# Patient Record
Sex: Female | Born: 1935
Health system: Southern US, Community
[De-identification: ages and names within clinical notes are randomized; demographics above are authoritative.]

## PROBLEM LIST (undated history)

## (undated) DIAGNOSIS — I10 Essential (primary) hypertension: Secondary | ICD-10-CM

## (undated) DIAGNOSIS — G4700 Insomnia, unspecified: Secondary | ICD-10-CM

## (undated) DIAGNOSIS — K56609 Unspecified intestinal obstruction, unspecified as to partial versus complete obstruction: Secondary | ICD-10-CM

## (undated) DIAGNOSIS — K219 Gastro-esophageal reflux disease without esophagitis: Secondary | ICD-10-CM

## (undated) HISTORY — PX: MOUTH SURGERY: SHX715

---

## 2011-02-14 DIAGNOSIS — H903 Sensorineural hearing loss, bilateral: Secondary | ICD-10-CM | POA: Diagnosis not present

## 2011-03-29 DIAGNOSIS — F411 Generalized anxiety disorder: Secondary | ICD-10-CM | POA: Diagnosis not present

## 2011-03-29 DIAGNOSIS — I10 Essential (primary) hypertension: Secondary | ICD-10-CM | POA: Diagnosis not present

## 2011-03-29 DIAGNOSIS — R413 Other amnesia: Secondary | ICD-10-CM | POA: Diagnosis not present

## 2011-04-05 DIAGNOSIS — H43819 Vitreous degeneration, unspecified eye: Secondary | ICD-10-CM | POA: Diagnosis not present

## 2011-04-05 DIAGNOSIS — H35349 Macular cyst, hole, or pseudohole, unspecified eye: Secondary | ICD-10-CM | POA: Diagnosis not present

## 2011-04-05 DIAGNOSIS — H353190 Nonexudative age-related macular degeneration, unspecified eye, stage unspecified: Secondary | ICD-10-CM | POA: Diagnosis not present

## 2011-04-19 DIAGNOSIS — R5381 Other malaise: Secondary | ICD-10-CM | POA: Diagnosis not present

## 2011-04-19 DIAGNOSIS — I10 Essential (primary) hypertension: Secondary | ICD-10-CM | POA: Diagnosis not present

## 2011-04-19 DIAGNOSIS — R413 Other amnesia: Secondary | ICD-10-CM | POA: Diagnosis not present

## 2011-06-07 DIAGNOSIS — E7800 Pure hypercholesterolemia, unspecified: Secondary | ICD-10-CM | POA: Diagnosis not present

## 2011-06-07 DIAGNOSIS — I10 Essential (primary) hypertension: Secondary | ICD-10-CM | POA: Diagnosis not present

## 2011-06-07 DIAGNOSIS — R413 Other amnesia: Secondary | ICD-10-CM | POA: Diagnosis not present

## 2011-08-09 DIAGNOSIS — R413 Other amnesia: Secondary | ICD-10-CM | POA: Diagnosis not present

## 2011-08-09 DIAGNOSIS — J309 Allergic rhinitis, unspecified: Secondary | ICD-10-CM | POA: Diagnosis not present

## 2011-08-09 DIAGNOSIS — I10 Essential (primary) hypertension: Secondary | ICD-10-CM | POA: Diagnosis not present

## 2011-08-09 DIAGNOSIS — R259 Unspecified abnormal involuntary movements: Secondary | ICD-10-CM | POA: Diagnosis not present

## 2011-08-09 DIAGNOSIS — G479 Sleep disorder, unspecified: Secondary | ICD-10-CM | POA: Diagnosis not present

## 2011-08-13 ENCOUNTER — Other Ambulatory Visit: Payer: Self-pay

## 2011-08-13 ENCOUNTER — Encounter (HOSPITAL_COMMUNITY): Payer: Self-pay | Admitting: Emergency Medicine

## 2011-08-13 ENCOUNTER — Emergency Department (HOSPITAL_COMMUNITY)
Admission: EM | Admit: 2011-08-13 | Discharge: 2011-08-13 | Disposition: A | Discharge status: Home / Self Care | Payer: Medicare Other | Attending: Emergency Medicine | Admitting: Emergency Medicine

## 2011-08-13 DIAGNOSIS — H9319 Tinnitus, unspecified ear: Secondary | ICD-10-CM | POA: Insufficient documentation

## 2011-08-13 DIAGNOSIS — G4700 Insomnia, unspecified: Secondary | ICD-10-CM | POA: Insufficient documentation

## 2011-08-13 DIAGNOSIS — R5381 Other malaise: Secondary | ICD-10-CM | POA: Insufficient documentation

## 2011-08-13 DIAGNOSIS — K219 Gastro-esophageal reflux disease without esophagitis: Secondary | ICD-10-CM | POA: Diagnosis not present

## 2011-08-13 DIAGNOSIS — I10 Essential (primary) hypertension: Secondary | ICD-10-CM | POA: Diagnosis not present

## 2011-08-13 DIAGNOSIS — R51 Headache: Secondary | ICD-10-CM | POA: Diagnosis not present

## 2011-08-13 DIAGNOSIS — R6889 Other general symptoms and signs: Secondary | ICD-10-CM | POA: Diagnosis not present

## 2011-08-13 DIAGNOSIS — R509 Fever, unspecified: Secondary | ICD-10-CM | POA: Diagnosis not present

## 2011-08-13 HISTORY — DX: Gastro-esophageal reflux disease without esophagitis: K21.9

## 2011-08-13 HISTORY — DX: Insomnia, unspecified: G47.00

## 2011-08-13 HISTORY — DX: Unspecified intestinal obstruction, unspecified as to partial versus complete obstruction: K56.609

## 2011-08-13 HISTORY — DX: Essential (primary) hypertension: I10

## 2011-08-13 LAB — URINALYSIS, ROUTINE W REFLEX MICROSCOPIC
Bilirubin Urine: NEGATIVE
Nitrite: NEGATIVE
Specific Gravity, Urine: 1.011 (ref 1.005–1.030)
pH: 7.5 (ref 5.0–8.0)

## 2011-08-13 LAB — BASIC METABOLIC PANEL
BUN: 14 mg/dL (ref 6–23)
Chloride: 100 mEq/L (ref 96–112)
Glucose, Bld: 114 mg/dL — ABNORMAL HIGH (ref 70–99)
Potassium: 3.8 mEq/L (ref 3.5–5.1)

## 2011-08-13 LAB — CBC WITH DIFFERENTIAL/PLATELET
Hemoglobin: 13.6 g/dL (ref 12.0–15.0)
Lymphs Abs: 1.7 10*3/uL (ref 0.7–4.0)
MCH: 30.6 pg (ref 26.0–34.0)
Monocytes Relative: 7 % (ref 3–12)
Neutro Abs: 4.9 10*3/uL (ref 1.7–7.7)
Neutrophils Relative %: 68 % (ref 43–77)
RBC: 4.45 MIL/uL (ref 3.87–5.11)

## 2011-08-13 LAB — URINE MICROSCOPIC-ADD ON

## 2011-08-13 LAB — POCT I-STAT TROPONIN I

## 2011-08-13 MED ORDER — MIRTAZAPINE 7.5 MG PO TABS: 7.5000 mg | ORAL_TABLET | Freq: Every day | ORAL | Status: DC

## 2011-08-13 MED ORDER — AMLODIPINE BESYLATE 2.5 MG PO TABS: 2.5000 mg | ORAL_TABLET | Freq: Every day | ORAL | Status: DC

## 2011-08-13 NOTE — ED Notes (Signed)
Assisted pt on bedpan. 

## 2011-08-13 NOTE — ED Provider Notes (Signed)
History     CSN: [id number]  Arrival date & time 08/13/11  1302   First MD Initiated Contact with Patient 08/13/11 1309      Chief Complaint  Patient presents with  . Tremors    (Consider location/radiation/quality/duration/timing/severity/associated sxs/prior treatment) HPI Level 5 caveat due to dementia  Pt brought to the ED via EMS from local assisted living. Pt states she has not been feeling well the last few days, generalized weakness, ringing in her ears, and insomnia. She has history of sleeping problems, previously on Halcyon but has not taken that in a long time. She also has history of essential tremor. She was recently seen at her PCP office and had her metoprolol stopped (presumably due to low HR). She also was started on Ambien 5mg  qhs and donepezil 10mg  qhs. She does not feel that these medications are helping her. She denies taking any extra medications by accident, but son in law at the bedside is concerned about her following her medication regimen accurately.   Past Medical History  Diagnosis Date  . Hypertension   . Gastro - esophageal reflux disease   . Insomnia   . Small bowel obstruction     No past surgical history on file.  No family history on file.  History  Substance Use Topics  . Smoking status: Never Smoker   . Smokeless tobacco: Not on file  . Alcohol Use: No    OB History    Grav Para Term Preterm Abortions TAB SAB Ect Mult Living                  Review of Systems Unable to assess due to mental status.   Allergies  Review of patient's allergies indicates not on file.  Home Medications  No current outpatient prescriptions on file.  BP 167/102  Pulse 64  Temp 98.1 F (36.7 C) (Oral)  Resp 16  Ht 6\' 1"  (1.854 m)  Wt 131 lb (59.421 kg)  BMI 17.28 kg/m2  SpO2 98%  Physical Exam  Nursing note and vitals reviewed. Constitutional: She appears well-developed and well-nourished.  HENT:  Head: Normocephalic and atraumatic.    Eyes: EOM are normal. Pupils are equal, round, and reactive to light.  Neck: Normal range of motion. Neck supple.  Cardiovascular: Normal rate, normal heart sounds and intact distal pulses.   Pulmonary/Chest: Effort normal and breath sounds normal.  Abdominal: Bowel sounds are normal. She exhibits no distension. There is no tenderness.  Musculoskeletal: Normal range of motion. She exhibits no edema and no tenderness.  Neurological: She is alert. She has normal strength. No cranial nerve deficit or sensory deficit. She exhibits normal muscle tone.       Essential tremor of head and to a lesser extent hands  Skin: Skin is warm and dry. No rash noted.  Psychiatric: She has a normal mood and affect.    ED Course  Procedures (including critical care time)   Labs Reviewed  CBC WITH DIFFERENTIAL  BASIC METABOLIC PANEL  URINALYSIS, ROUTINE W REFLEX MICROSCOPIC  SALICYLATE LEVEL   No results found.   No diagnosis found.    MDM   Date: 08/13/2011  Rate: 65  Rhythm: normal sinus rhythm  QRS Axis: normal  Intervals: QT prolonged  ST/T Wave abnormalities: nonspecific ST/T changes  Conduction Disutrbances:none  Narrative Interpretation:   Old EKG Reviewed: none available  Pt is quite hypertensive in the ED. I suspect much of her symptoms are related to medication changes. Will  attempt to contact Dr. Pete Glatter.    3:20 PM Spoke with Dr. Pete Glatter who recommends Remeron 7.5mg  qhs and Norvasc 2.5mg  daily. Family aware of plan. Otherwise labs unremarkable. Will plan to d/c with those medication adjustments.        Charles B. Bernette Mayers, MD 08/13/11 1521

## 2011-08-13 NOTE — ED Notes (Signed)
Bernette Mayers, MD at bedside for evaluation.

## 2011-08-13 NOTE — ED Notes (Signed)
RN/MD notified of blood pressure results.

## 2011-08-13 NOTE — ED Notes (Signed)
Per [facility], patient recently has sleeping medications changed by her PCP.  Patient has not slept in 3 days and now has tremors in jaw and upper body.

## 2011-08-13 NOTE — ED Notes (Signed)
Patients daughter advised that patient took too many of her sleeping pills.  Patient advises that she did also.  Daughter advised that patient does have tremors baseline, but they are grossly exaggerated at this time.  Daughter claims that she also has not slept in 3 days.

## 2011-08-18 ENCOUNTER — Ambulatory Visit (INDEPENDENT_AMBULATORY_CARE_PROVIDER_SITE_OTHER): Payer: Medicare Other | Admitting: Emergency Medicine

## 2011-08-18 VITALS — BP 152/81 | HR 73 | Temp 98.6°F | Resp 17 | Ht 61.0 in | Wt 128.0 lb

## 2011-08-18 DIAGNOSIS — R4182 Altered mental status, unspecified: Secondary | ICD-10-CM | POA: Diagnosis not present

## 2011-08-18 LAB — POCT URINALYSIS DIPSTICK
Nitrite, UA: NEGATIVE
Protein, UA: NEGATIVE
pH, UA: 5.5

## 2011-08-18 LAB — POCT UA - MICROSCOPIC ONLY
Casts, Ur, LPF, POC: NEGATIVE
Yeast, UA: NEGATIVE

## 2011-08-18 NOTE — Progress Notes (Signed)
  Subjective:    Patient ID: Julie Oliver, female    DOB: [birth year], 78 y.o.   MRN: [id number]  Altered Mental Status This is a new problem. The current episode started in the past 7 days. The problem occurs constantly. The problem has been gradually worsening. Pertinent negatives include no abdominal pain, anorexia, arthralgias, change in bowel habit, chest pain, chills, congestion, coughing, diaphoresis, fatigue, fever, headaches, joint swelling, myalgias, nausea, neck pain, numbness, rash, sore throat, swollen glands, urinary symptoms, vertigo, visual change, vomiting or weakness.      Review of Systems  Constitutional: Negative for fever, chills, diaphoresis, activity change, appetite change and fatigue.  HENT: Negative.  Negative for congestion, sore throat and neck pain.   Eyes: Negative.   Respiratory: Negative.  Negative for cough.   Cardiovascular: Negative.  Negative for chest pain.  Gastrointestinal: Negative.  Negative for nausea, vomiting, abdominal pain, anorexia and change in bowel habit.  Genitourinary: Negative.   Musculoskeletal: Negative.  Negative for myalgias, joint swelling and arthralgias.  Skin: Negative for rash.  Neurological: Negative for dizziness, vertigo, tremors, seizures, syncope, facial asymmetry, speech difficulty, weakness, light-headedness, numbness and headaches.  Hematological: Negative.   Psychiatric/Behavioral: Positive for hallucinations, confusion, dysphoric mood, agitation and altered mental status.       Objective:   Physical Exam  Constitutional: She is oriented to person, place, and time. She appears well-developed and well-nourished.  HENT:  Head: Normocephalic and atraumatic.  Eyes: Pupils are equal, round, and reactive to light. No scleral icterus.  Cardiovascular: Normal rate and regular rhythm.   Pulmonary/Chest: Effort normal.  Abdominal: Soft.  Musculoskeletal: Normal range of motion.  Neurological: She is alert and oriented to  person, place, and time. Coordination normal.  Skin: Skin is warm and dry.          Assessment & Plan:   Insomnia after discontinuing sleeping pill.  Having bad dreams.  Thinks she is overmedicated.  meds changed a week ago Friday and then to ER on Tuesday.  Requested halcyon and she was refused.  I suggested that she follow up tomorrow with her doctor that I will not change her meds.  Results for orders placed in visit on 08/18/11  POCT URINALYSIS DIPSTICK      Component Value Range   Color, UA pale yellow     Clarity, UA clear     Glucose, UA neg     Bilirubin, UA neg     Ketones, UA neg     Spec Grav, UA <=1.005     Blood, UA neg     pH, UA 5.5     Protein, UA neg     Urobilinogen, UA 0.2     Nitrite, UA neg     Leukocytes, UA Trace    POCT UA - MICROSCOPIC ONLY      Component Value Range   WBC, Ur, HPF, POC 0-1     RBC, urine, microscopic neg     Bacteria, U Microscopic trace     Mucus, UA neg     Epithelial cells, urine per micros 0-1     Crystals, Ur, HPF, POC neg     Casts, Ur, LPF, POC neg     Yeast, UA neg

## 2011-08-21 ENCOUNTER — Other Ambulatory Visit: Payer: Self-pay | Admitting: *Deleted

## 2011-08-21 DIAGNOSIS — R4182 Altered mental status, unspecified: Secondary | ICD-10-CM

## 2011-08-22 ENCOUNTER — Ambulatory Visit
Admission: RE | Admit: 2011-08-22 | Discharge: 2011-08-22 | Disposition: A | Discharge status: Home / Self Care | Payer: Medicare Other | Source: Ambulatory Visit | Attending: *Deleted | Admitting: *Deleted

## 2011-08-22 DIAGNOSIS — I999 Unspecified disorder of circulatory system: Secondary | ICD-10-CM | POA: Diagnosis not present

## 2011-08-22 DIAGNOSIS — R4182 Altered mental status, unspecified: Secondary | ICD-10-CM | POA: Diagnosis not present

## 2011-08-22 DIAGNOSIS — F29 Unspecified psychosis not due to a substance or known physiological condition: Secondary | ICD-10-CM | POA: Diagnosis not present

## 2011-08-22 MED ORDER — IOHEXOL 300 MG/ML  SOLN
80.0000 mL | Freq: Once | INTRAMUSCULAR | Status: AC | PRN
  Administered 2011-08-22: 80 mL via INTRAVENOUS

## 2011-08-29 DIAGNOSIS — D649 Anemia, unspecified: Secondary | ICD-10-CM | POA: Diagnosis not present

## 2011-09-27 DIAGNOSIS — I10 Essential (primary) hypertension: Secondary | ICD-10-CM | POA: Diagnosis not present

## 2011-09-27 DIAGNOSIS — Z0000 Encounter for general adult medical examination without abnormal findings: Secondary | ICD-10-CM | POA: Diagnosis not present

## 2011-09-27 DIAGNOSIS — E559 Vitamin D deficiency, unspecified: Secondary | ICD-10-CM | POA: Diagnosis not present

## 2011-09-27 DIAGNOSIS — D649 Anemia, unspecified: Secondary | ICD-10-CM | POA: Diagnosis not present

## 2011-11-08 DIAGNOSIS — Z23 Encounter for immunization: Secondary | ICD-10-CM | POA: Diagnosis not present

## 2011-11-08 DIAGNOSIS — I10 Essential (primary) hypertension: Secondary | ICD-10-CM | POA: Diagnosis not present

## 2011-11-25 DIAGNOSIS — H353190 Nonexudative age-related macular degeneration, unspecified eye, stage unspecified: Secondary | ICD-10-CM | POA: Diagnosis not present

## 2011-11-25 DIAGNOSIS — H35349 Macular cyst, hole, or pseudohole, unspecified eye: Secondary | ICD-10-CM | POA: Diagnosis not present

## 2011-11-25 DIAGNOSIS — H43819 Vitreous degeneration, unspecified eye: Secondary | ICD-10-CM | POA: Diagnosis not present

## 2011-12-26 DIAGNOSIS — H353190 Nonexudative age-related macular degeneration, unspecified eye, stage unspecified: Secondary | ICD-10-CM | POA: Diagnosis not present

## 2012-02-25 ENCOUNTER — Other Ambulatory Visit: Payer: Self-pay | Admitting: Neurology

## 2012-02-25 DIAGNOSIS — G500 Trigeminal neuralgia: Secondary | ICD-10-CM

## 2012-02-25 DIAGNOSIS — M792 Neuralgia and neuritis, unspecified: Secondary | ICD-10-CM

## 2012-04-16 DIAGNOSIS — L259 Unspecified contact dermatitis, unspecified cause: Secondary | ICD-10-CM | POA: Diagnosis not present

## 2012-04-16 DIAGNOSIS — M79609 Pain in unspecified limb: Secondary | ICD-10-CM | POA: Diagnosis not present

## 2012-04-16 DIAGNOSIS — B372 Candidiasis of skin and nail: Secondary | ICD-10-CM | POA: Diagnosis not present

## 2012-04-16 DIAGNOSIS — B351 Tinea unguium: Secondary | ICD-10-CM | POA: Diagnosis not present

## 2012-06-12 DIAGNOSIS — Z79899 Other long term (current) drug therapy: Secondary | ICD-10-CM | POA: Diagnosis not present

## 2012-06-12 DIAGNOSIS — H6120 Impacted cerumen, unspecified ear: Secondary | ICD-10-CM | POA: Diagnosis not present

## 2012-06-12 DIAGNOSIS — I129 Hypertensive chronic kidney disease with stage 1 through stage 4 chronic kidney disease, or unspecified chronic kidney disease: Secondary | ICD-10-CM | POA: Diagnosis not present

## 2012-06-19 ENCOUNTER — Ambulatory Visit: Payer: Self-pay | Admitting: Neurology

## 2012-07-17 DIAGNOSIS — I129 Hypertensive chronic kidney disease with stage 1 through stage 4 chronic kidney disease, or unspecified chronic kidney disease: Secondary | ICD-10-CM | POA: Diagnosis not present

## 2012-07-22 DIAGNOSIS — I10 Essential (primary) hypertension: Secondary | ICD-10-CM | POA: Diagnosis not present

## 2012-07-22 DIAGNOSIS — H6120 Impacted cerumen, unspecified ear: Secondary | ICD-10-CM | POA: Diagnosis not present

## 2012-07-22 DIAGNOSIS — H903 Sensorineural hearing loss, bilateral: Secondary | ICD-10-CM | POA: Diagnosis not present

## 2012-11-27 DIAGNOSIS — Z23 Encounter for immunization: Secondary | ICD-10-CM | POA: Diagnosis not present

## 2012-11-27 DIAGNOSIS — K5900 Constipation, unspecified: Secondary | ICD-10-CM | POA: Diagnosis not present

## 2012-11-27 DIAGNOSIS — E559 Vitamin D deficiency, unspecified: Secondary | ICD-10-CM | POA: Diagnosis not present

## 2012-11-27 DIAGNOSIS — Z79899 Other long term (current) drug therapy: Secondary | ICD-10-CM | POA: Diagnosis not present

## 2012-11-27 DIAGNOSIS — I10 Essential (primary) hypertension: Secondary | ICD-10-CM | POA: Diagnosis not present

## 2012-12-04 DIAGNOSIS — Z961 Presence of intraocular lens: Secondary | ICD-10-CM | POA: Diagnosis not present

## 2012-12-04 DIAGNOSIS — H524 Presbyopia: Secondary | ICD-10-CM | POA: Diagnosis not present

## 2012-12-04 DIAGNOSIS — H5210 Myopia, unspecified eye: Secondary | ICD-10-CM | POA: Diagnosis not present

## 2013-01-01 DIAGNOSIS — H353290 Exudative age-related macular degeneration, unspecified eye, stage unspecified: Secondary | ICD-10-CM | POA: Diagnosis not present

## 2013-01-01 DIAGNOSIS — H35349 Macular cyst, hole, or pseudohole, unspecified eye: Secondary | ICD-10-CM | POA: Diagnosis not present

## 2013-01-01 DIAGNOSIS — H43819 Vitreous degeneration, unspecified eye: Secondary | ICD-10-CM | POA: Diagnosis not present

## 2013-01-01 DIAGNOSIS — H353190 Nonexudative age-related macular degeneration, unspecified eye, stage unspecified: Secondary | ICD-10-CM | POA: Diagnosis not present

## 2013-05-28 DIAGNOSIS — I10 Essential (primary) hypertension: Secondary | ICD-10-CM | POA: Diagnosis not present

## 2013-05-28 DIAGNOSIS — M25519 Pain in unspecified shoulder: Secondary | ICD-10-CM | POA: Diagnosis not present

## 2013-05-28 DIAGNOSIS — F329 Major depressive disorder, single episode, unspecified: Secondary | ICD-10-CM | POA: Diagnosis not present

## 2013-05-28 DIAGNOSIS — H6120 Impacted cerumen, unspecified ear: Secondary | ICD-10-CM | POA: Diagnosis not present

## 2013-05-28 DIAGNOSIS — F3289 Other specified depressive episodes: Secondary | ICD-10-CM | POA: Diagnosis not present

## 2013-05-28 DIAGNOSIS — H9319 Tinnitus, unspecified ear: Secondary | ICD-10-CM | POA: Diagnosis not present

## 2013-05-28 DIAGNOSIS — G479 Sleep disorder, unspecified: Secondary | ICD-10-CM | POA: Diagnosis not present

## 2013-05-28 DIAGNOSIS — F411 Generalized anxiety disorder: Secondary | ICD-10-CM | POA: Diagnosis not present

## 2013-06-09 DIAGNOSIS — I10 Essential (primary) hypertension: Secondary | ICD-10-CM | POA: Diagnosis not present

## 2013-06-09 DIAGNOSIS — H903 Sensorineural hearing loss, bilateral: Secondary | ICD-10-CM | POA: Diagnosis not present

## 2013-06-09 DIAGNOSIS — H6120 Impacted cerumen, unspecified ear: Secondary | ICD-10-CM | POA: Diagnosis not present

## 2013-08-18 DIAGNOSIS — G479 Sleep disorder, unspecified: Secondary | ICD-10-CM | POA: Diagnosis not present

## 2013-08-18 DIAGNOSIS — I129 Hypertensive chronic kidney disease with stage 1 through stage 4 chronic kidney disease, or unspecified chronic kidney disease: Secondary | ICD-10-CM | POA: Diagnosis not present

## 2013-08-18 DIAGNOSIS — N183 Chronic kidney disease, stage 3 unspecified: Secondary | ICD-10-CM | POA: Diagnosis not present

## 2013-10-07 DIAGNOSIS — H903 Sensorineural hearing loss, bilateral: Secondary | ICD-10-CM | POA: Diagnosis not present

## 2013-10-07 DIAGNOSIS — I10 Essential (primary) hypertension: Secondary | ICD-10-CM | POA: Diagnosis not present

## 2013-10-07 DIAGNOSIS — H6120 Impacted cerumen, unspecified ear: Secondary | ICD-10-CM | POA: Diagnosis not present

## 2013-10-08 DIAGNOSIS — H353290 Exudative age-related macular degeneration, unspecified eye, stage unspecified: Secondary | ICD-10-CM | POA: Diagnosis not present

## 2013-10-08 DIAGNOSIS — H5210 Myopia, unspecified eye: Secondary | ICD-10-CM | POA: Diagnosis not present

## 2013-10-08 DIAGNOSIS — H524 Presbyopia: Secondary | ICD-10-CM | POA: Diagnosis not present

## 2013-10-22 ENCOUNTER — Encounter: Payer: Self-pay | Admitting: *Deleted

## 2013-12-09 ENCOUNTER — Ambulatory Visit (INDEPENDENT_AMBULATORY_CARE_PROVIDER_SITE_OTHER): Payer: Medicare Other | Admitting: Podiatry

## 2013-12-09 ENCOUNTER — Encounter: Payer: Self-pay | Admitting: Podiatry

## 2013-12-09 VITALS — BP 169/69 | HR 64 | Resp 16

## 2013-12-09 DIAGNOSIS — M2040 Other hammer toe(s) (acquired), unspecified foot: Secondary | ICD-10-CM | POA: Diagnosis not present

## 2013-12-09 DIAGNOSIS — M79673 Pain in unspecified foot: Secondary | ICD-10-CM | POA: Diagnosis not present

## 2013-12-09 DIAGNOSIS — L84 Corns and callosities: Secondary | ICD-10-CM

## 2013-12-09 DIAGNOSIS — B351 Tinea unguium: Secondary | ICD-10-CM

## 2013-12-13 NOTE — Progress Notes (Signed)
Subjective:     Patient ID: Julie Oliver, female   DOB: 03-01-1919, 80 y.o.   MRN: [id number]  HPIpatient presents with caregiver stating that she has painful lesions on the ends of several toes and the toes themselves are sore as they're lifted in the air and they get red and inflamed when she wears shoes   Review of Systems     Objective:   Physical Exam Neurovascular status intact with no significant changes and noted to have nail disease 1-5 both feet that are thick and painful with yellow debris and keratotic lesion distal third left with elevation of the toes of a rigid nature and redness on top of the toes    Assessment:     Chronic mycotic nail infection 1-5 both feet with lesion third left and hammertoe formation    Plan:     H&P performed and debridement of lesions and nail bed 1-5 both feet was accomplished with no iatrogenic bleeding noted

## 2013-12-24 DIAGNOSIS — F325 Major depressive disorder, single episode, in full remission: Secondary | ICD-10-CM | POA: Diagnosis not present

## 2013-12-24 DIAGNOSIS — N183 Chronic kidney disease, stage 3 (moderate): Secondary | ICD-10-CM | POA: Diagnosis not present

## 2013-12-24 DIAGNOSIS — Z23 Encounter for immunization: Secondary | ICD-10-CM | POA: Diagnosis not present

## 2013-12-24 DIAGNOSIS — I129 Hypertensive chronic kidney disease with stage 1 through stage 4 chronic kidney disease, or unspecified chronic kidney disease: Secondary | ICD-10-CM | POA: Diagnosis not present

## 2013-12-24 DIAGNOSIS — Z79899 Other long term (current) drug therapy: Secondary | ICD-10-CM | POA: Diagnosis not present

## 2014-02-12 ENCOUNTER — Encounter (HOSPITAL_COMMUNITY): Payer: Self-pay | Admitting: Emergency Medicine

## 2014-02-12 ENCOUNTER — Emergency Department (HOSPITAL_COMMUNITY)
Admission: EM | Admit: 2014-02-12 | Discharge: 2014-02-12 | Disposition: A | Discharge status: Home / Self Care | Payer: Medicare Other | Attending: Emergency Medicine | Admitting: Emergency Medicine

## 2014-02-12 ENCOUNTER — Emergency Department (HOSPITAL_COMMUNITY): Payer: Medicare Other

## 2014-02-12 DIAGNOSIS — Z79899 Other long term (current) drug therapy: Secondary | ICD-10-CM | POA: Insufficient documentation

## 2014-02-12 DIAGNOSIS — R0789 Other chest pain: Secondary | ICD-10-CM | POA: Insufficient documentation

## 2014-02-12 DIAGNOSIS — K449 Diaphragmatic hernia without obstruction or gangrene: Secondary | ICD-10-CM | POA: Diagnosis not present

## 2014-02-12 DIAGNOSIS — K219 Gastro-esophageal reflux disease without esophagitis: Secondary | ICD-10-CM | POA: Insufficient documentation

## 2014-02-12 DIAGNOSIS — Z7982 Long term (current) use of aspirin: Secondary | ICD-10-CM | POA: Insufficient documentation

## 2014-02-12 DIAGNOSIS — Z8669 Personal history of other diseases of the nervous system and sense organs: Secondary | ICD-10-CM | POA: Diagnosis not present

## 2014-02-12 DIAGNOSIS — I10 Essential (primary) hypertension: Secondary | ICD-10-CM | POA: Diagnosis not present

## 2014-02-12 DIAGNOSIS — R079 Chest pain, unspecified: Secondary | ICD-10-CM | POA: Diagnosis present

## 2014-02-12 LAB — URINALYSIS, ROUTINE W REFLEX MICROSCOPIC
Bilirubin Urine: NEGATIVE
Glucose, UA: NEGATIVE mg/dL
Ketones, ur: NEGATIVE mg/dL
Nitrite: NEGATIVE
PROTEIN: NEGATIVE mg/dL
Specific Gravity, Urine: 1.009 (ref 1.005–1.030)
Urobilinogen, UA: 0.2 mg/dL (ref 0.0–1.0)
pH: 6 (ref 5.0–8.0)

## 2014-02-12 LAB — HEPATIC FUNCTION PANEL
ALK PHOS: 105 U/L (ref 39–117)
ALT: 15 U/L (ref 0–35)
AST: 24 U/L (ref 0–37)
Albumin: 3.9 g/dL (ref 3.5–5.2)
BILIRUBIN INDIRECT: 0.3 mg/dL (ref 0.3–0.9)
BILIRUBIN TOTAL: 0.4 mg/dL (ref 0.3–1.2)
Bilirubin, Direct: 0.1 mg/dL (ref 0.0–0.3)
Total Protein: 7 g/dL (ref 6.0–8.3)

## 2014-02-12 LAB — CBC
HCT: 40.3 % (ref 36.0–46.0)
Hemoglobin: 13.6 g/dL (ref 12.0–15.0)
MCH: 30.4 pg (ref 26.0–34.0)
MCHC: 33.7 g/dL (ref 30.0–36.0)
MCV: 90 fL (ref 78.0–100.0)
PLATELETS: 248 10*3/uL (ref 150–400)
RBC: 4.48 MIL/uL (ref 3.87–5.11)
RDW: 12.8 % (ref 11.5–15.5)
WBC: 8.1 10*3/uL (ref 4.0–10.5)

## 2014-02-12 LAB — BASIC METABOLIC PANEL
ANION GAP: 8 (ref 5–15)
BUN: 25 mg/dL — AB (ref 6–23)
CALCIUM: 9 mg/dL (ref 8.4–10.5)
CO2: 23 mmol/L (ref 19–32)
CREATININE: 1.03 mg/dL (ref 0.50–1.10)
Chloride: 105 mEq/L (ref 96–112)
GFR calc non Af Amer: 46 mL/min — ABNORMAL LOW (ref >90)
GFR, EST AFRICAN AMERICAN: 53 mL/min — AB (ref >90)
Glucose, Bld: 112 mg/dL — ABNORMAL HIGH (ref 70–99)
Potassium: 4.1 mmol/L (ref 3.5–5.1)
Sodium: 136 mmol/L (ref 135–145)

## 2014-02-12 LAB — URINE MICROSCOPIC-ADD ON

## 2014-02-12 LAB — I-STAT TROPONIN, ED: Troponin i, poc: 0 ng/mL (ref 0.00–0.08)

## 2014-02-12 LAB — LIPASE, BLOOD: Lipase: 44 U/L (ref 11–59)

## 2014-02-12 MED ORDER — OMEPRAZOLE 20 MG PO CPDR: 40.0000 mg | DELAYED_RELEASE_CAPSULE | Freq: Every day | ORAL | Status: DC

## 2014-02-12 MED ORDER — DOCUSATE SODIUM 100 MG PO CAPS: 100.0000 mg | ORAL_CAPSULE | Freq: Every day | ORAL | Status: DC

## 2014-02-12 MED ORDER — SUCRALFATE 1 GM/10ML PO SUSP: 1.0000 g | Freq: Three times a day (TID) | ORAL | Status: DC

## 2014-02-12 NOTE — ED Notes (Addendum)
Patient c/o lower chest pain. Hx bowel obstruction with surgical intervention in 1999. Says this pain feels "the exact same." Pain started a week ago but was hoping pain would go away. Describes pain as "sharp" and started between her breasts a week ago. Pain is intermittent for patient. Has consistent constipation problems. Did not lose consciousness this week. Denies SOB. Daughter says she "has noticed her stomach seems more swollen." A&Ox4. Moving all extremities. Neurologically intact.

## 2014-02-12 NOTE — Discharge Instructions (Signed)
Chest Pain (Nonspecific) °It is often hard to give a specific diagnosis for the cause of chest pain. There is always a chance that your pain could be related to something serious, such as a heart attack or a blood clot in the lungs. You need to follow up with your health care provider for further evaluation. °CAUSES  °· Heartburn. °· Pneumonia or bronchitis. °· Anxiety or stress. °· Inflammation around your heart (pericarditis) or lung (pleuritis or pleurisy). °· A blood clot in the lung. °· A collapsed lung (pneumothorax). It can develop suddenly on its own (spontaneous pneumothorax) or from trauma to the chest. °· Shingles infection (herpes zoster virus). °The chest wall is composed of bones, muscles, and cartilage. Any of these can be the source of the pain. °· The bones can be bruised by injury. °· The muscles or cartilage can be strained by coughing or overwork. °· The cartilage can be affected by inflammation and become sore (costochondritis). °DIAGNOSIS  °Lab tests or other studies may be needed to find the cause of your pain. Your health care provider may have you take a test called an ambulatory electrocardiogram (ECG). An ECG records your heartbeat patterns over a 24-hour period. You may also have other tests, such as: °· Transthoracic echocardiogram (TTE). During echocardiography, sound waves are used to evaluate how blood flows through your heart. °· Transesophageal echocardiogram (TEE). °· Cardiac monitoring. This allows your health care provider to monitor your heart rate and rhythm in real time. °· Holter monitor. This is a portable device that records your heartbeat and can help diagnose heart arrhythmias. It allows your health care provider to track your heart activity for several days, if needed. °· Stress tests by exercise or by giving medicine that makes the heart beat faster. °TREATMENT  °· Treatment depends on what may be causing your chest pain. Treatment may include: °¨ Acid blockers for  heartburn. °¨ Anti-inflammatory medicine. °¨ Pain medicine for inflammatory conditions. °¨ Antibiotics if an infection is present. °· You may be advised to change lifestyle habits. This includes stopping smoking and avoiding alcohol, caffeine, and chocolate. °· You may be advised to keep your head raised (elevated) when sleeping. This reduces the chance of acid going backward from your stomach into your esophagus. °Most of the time, nonspecific chest pain will improve within 2-3 days with rest and mild pain medicine.  °HOME CARE INSTRUCTIONS  °· If antibiotics were prescribed, take them as directed. Finish them even if you start to feel better. °· For the next few days, avoid physical activities that bring on chest pain. Continue physical activities as directed. °· Do not use any tobacco products, including cigarettes, chewing tobacco, or electronic cigarettes. °· Avoid drinking alcohol. °· Only take medicine as directed by your health care provider. °· Follow your health care provider's suggestions for further testing if your chest pain does not go away. °· Keep any follow-up appointments you made. If you do not go to an appointment, you could develop lasting (chronic) problems with pain. If there is any problem keeping an appointment, call to reschedule. °SEEK MEDICAL CARE IF:  °· Your chest pain does not go away, even after treatment. °· You have a rash with blisters on your chest. °· You have a fever. °SEEK IMMEDIATE MEDICAL CARE IF:  °· You have increased chest pain or pain that spreads to your arm, neck, jaw, back, or abdomen. °· You have shortness of breath. °· You have an increasing cough, or you cough   up blood.  You have severe back or abdominal pain.  You feel nauseous or vomit.  You have severe weakness.  You faint.  You have chills. This is an emergency. Do not wait to see if the pain will go away. Get medical help at once. Call your local emergency services (911 in [place] Do not drive  yourself to the hospital. MAKE SURE YOU:   Understand these instructions.  Will watch your condition.  Will get help right away if you are not doing well or get worse. Document Released: 11/07/2004 Document Revised: 02/02/2013 Document Reviewed: 09/03/2007 [facility] Patient Information 2015 [place], [place]. This information is not intended to replace advice given to you by your health care provider. Make sure you discuss any questions you have with your health care provider. Hiatal Hernia A hiatal hernia occurs when part of your stomach slides above the muscle that separates your abdomen from your chest (diaphragm). You can be born with a hiatal hernia (congenital), or it may develop over time. In almost all cases of hiatal hernia, only the top part of the stomach pushes through.  Many people have a hiatal hernia with no symptoms. The larger the hernia, the more likely that you will have symptoms. In some cases, a hiatal hernia allows stomach acid to flow back into the tube that carries food from your mouth to your stomach (esophagus). This may cause heartburn symptoms. Severe heartburn symptoms may mean you have developed a condition called gastroesophageal reflux disease (GERD).  CAUSES  Hiatal hernias are caused by a weakness in the opening (hiatus) where your esophagus passes through your diaphragm to attach to the upper part of your stomach. You may be born with a weakness in your hiatus, or a weakness can develop. RISK FACTORS Older age is a major risk factor for a hiatal hernia. Anything that increases pressure on your diaphragm can also increase your risk of a hiatal hernia. This includes:  Pregnancy.  Excess weight.  Frequent constipation. SIGNS AND SYMPTOMS  People with a hiatal hernia often have no symptoms. If symptoms develop, they are almost always caused by GERD. They may include:  Heartburn.  Belching.  Indigestion.  Trouble swallowing.  Coughing or wheezing.  Sore  throat.  Hoarseness.  Chest pain. DIAGNOSIS  A hiatal hernia is sometimes found during an exam for another problem. Your health care provider may suspect a hiatal hernia if you have symptoms of GERD. Tests may be done to diagnose GERD. These may include:  X-rays of your stomach or chest.  An upper gastrointestinal (GI) series. This is an X-ray exam of your GI tract involving the use of a chalky liquid that you swallow. The liquid shows up clearly on the X-ray.  Endoscopy. This is a procedure to look into your stomach using a thin, flexible tube that has a tiny camera and light on the end of it. TREATMENT  If you have no symptoms, you may not need treatment. If you have symptoms, treatment may include:  Dietary and lifestyle changes to help reduce GERD symptoms.  Medicines. These may include:  Over-the-counter antacids.  Medicines that make your stomach empty more quickly.  Medicines that block the production of stomach acid (H2 blockers).  Stronger medicines to reduce stomach acid (proton pump inhibitors).  You may need surgery to repair the hernia if other treatments are not helping. HOME CARE INSTRUCTIONS   Take all medicines as directed by your health care provider.  Quit smoking, if you smoke.  Try to achieve and maintain a healthy body weight.  Eat frequent small meals instead of three large meals a day. This keeps your stomach from getting too full.  Eat slowly.  Do not lie down right after eating.  Do noteat 1-2 hours before bed.   Do not drink beverages with caffeine. These include cola, coffee, cocoa, and tea.  Do not drink alcohol.  Avoid foods that can make symptoms of GERD worse. These may include:  Fatty foods.  Citrus fruits.  Other foods and drinks that contain acid.  Avoid putting pressure on your belly. Anything that puts pressure on your belly increases the amount of acid that may be pushed up into your esophagus.   Avoid bending over,  especially after eating.  Raise the head of your bed by putting blocks under the legs. This keeps your head and esophagus higher than your stomach.  Do not wear tight clothing around your chest or stomach.  Try not to strain when having a bowel movement, when urinating, or when lifting heavy objects. SEEK MEDICAL CARE IF:  Your symptoms are not controlled with medicines or lifestyle changes.  You are having trouble swallowing.  You have coughing or wheezing that will not go away. SEEK IMMEDIATE MEDICAL CARE IF:  Your pain is getting worse.  Your pain spreads to your arms, neck, jaw, teeth, or back.  You have shortness of breath.  You sweat for no reason.  You feel sick to your stomach (nauseous) or vomit.  You vomit blood.  You have bright red blood in your stools.  You have black, tarry stools.  Document Released: 04/20/2003 Document Revised: 06/14/2013 Document Reviewed: 01/15/2013 [facility] Patient Information 2015 [place], [place]. This information is not intended to replace advice given to you by your health care provider. Make sure you discuss any questions you have with your health care provider.

## 2014-02-12 NOTE — ED Provider Notes (Signed)
CSN: [id number]     Arrival date & time 02/12/14  1551 History   First MD Initiated Contact with Patient 02/12/14 1601     Chief Complaint  Patient presents with  . Chest Pain     (Consider location/radiation/quality/duration/timing/severity/associated sxs/prior Treatment) HPI Patient c/o lower chest pain. Hx bowel obstruction with surgical intervention in 1999. Says this pain feels "the exact same." Pain started a week ago but was hoping pain would go away. Describes pain as "sharp" and started between her breasts a week ago. Pain is intermittent for patient. Has consistent constipation problems. Did not lose consciousness this week. Denies SOB. Daughter says she "has noticed her stomach seems more swollen." A&Ox4. Moving all extremities. Neurologically intact. MD agree. Patient does endorse her symptoms have been made worse by trying to eat. She does identify that as waxing and waning. She denies diaphoresis or shortness of breath. No radiation of pain.  The patient's daughter adds that she is chronically hypertensive. She reports is typical for her to have a systolic blood pressure in the 190s. She reports she is compliant with her medications. Past Medical History  Diagnosis Date  . Hypertension   . Gastro - esophageal reflux disease   . Insomnia   . Small bowel obstruction    History reviewed. No pertinent past surgical history. History reviewed. No pertinent family history. History  Substance Use Topics  . Smoking status: Never Smoker   . Smokeless tobacco: Not on file  . Alcohol Use: No   OB History    No data available     Review of Systems 10 Systems reviewed and are negative for acute change except as noted in the HPI.    Allergies  Review of patient's allergies indicates no known allergies.  Home Medications   Prior to Admission medications   Medication Sig Start Date End Date Taking? Authorizing Provider  aspirin 81 MG chewable tablet Chew 81 mg by mouth  daily.   Yes Historical Provider, MD  loratadine (CLARITIN) 10 MG tablet Take 10 mg by mouth daily.   Yes Historical Provider, MD  LORazepam (ATIVAN) 0.5 MG tablet Take 0.5 mg by mouth 2 (two) times daily as needed. For anxiety   Yes Historical Provider, MD  losartan (COZAAR) 50 MG tablet Take 50 mg by mouth 2 (two) times daily.    Yes Historical Provider, MD  omeprazole (PRILOSEC) 20 MG capsule Take 20 mg by mouth daily.   Yes Historical Provider, MD  triazolam (HALCION) 0.25 MG tablet Take 0.25 mg by mouth 2 (two) times daily as needed for sleep.   Yes Historical Provider, MD  verapamil (CALAN-SR) 240 MG CR tablet Take 240 mg by mouth 2 (two) times daily.    Yes Historical Provider, MD  Vitamin D, Ergocalciferol, (DRISDOL) 50000 UNITS CAPS Take 50,000 Units by mouth every 7 (seven) days.   Yes Historical Provider, MD  amLODipine (NORVASC) 2.5 MG tablet Take 1 tablet (2.5 mg total) by mouth daily. 08/13/11 08/12/12  Charles B. Karle Starch, MD  docusate sodium (COLACE) 100 MG capsule Take 1 capsule (100 mg total) by mouth daily. 02/12/14   Charlesetta Shanks, MD  mirtazapine (REMERON) 7.5 MG tablet Take 1 tablet (7.5 mg total) by mouth at bedtime. Patient not taking: Reported on 02/12/2014 08/13/11 09/12/11  Charles B. Karle Starch, MD  omeprazole (PRILOSEC) 20 MG capsule Take 2 capsules (40 mg total) by mouth daily. 02/12/14   Charlesetta Shanks, MD  sucralfate (CARAFATE) 1 GM/10ML suspension Take 10 mLs (1  g total) by mouth 4 (four) times daily -  with meals and at bedtime. 02/12/14   Charlesetta Shanks, MD   BP 220/96 mmHg  Pulse 73  Temp(Src) 98 F (36.7 C) (Oral)  Resp 19  SpO2 97% Physical Exam  Constitutional: She is oriented to person, place, and time. She appears well-developed and well-nourished.  HENT:  Head: Normocephalic and atraumatic.  Eyes: EOM are normal. Pupils are equal, round, and reactive to light.  Neck: Neck supple.  Cardiovascular: Normal rate, regular rhythm, normal heart sounds and intact distal  pulses.   Pulmonary/Chest: Effort normal and breath sounds normal.  Abdominal: Soft. Bowel sounds are normal. She exhibits no distension. There is tenderness (mild epigastric tenderness. Also mild lower abdominal tenderness. There is no guarding or mass in either region.).  Musculoskeletal: Normal range of motion. She exhibits no edema.  Neurological: She is alert and oriented to person, place, and time. She has normal strength. Coordination normal. GCS eye subscore is 4. GCS verbal subscore is 5. GCS motor subscore is 6.  Skin: Skin is warm, dry and intact.  Psychiatric: She has a normal mood and affect.    ED Course  Procedures (including critical care time) Labs Review Labs Reviewed  BASIC METABOLIC PANEL - Abnormal; Notable for the following:    Glucose, Bld 112 (*)    BUN 25 (*)    GFR calc non Af Amer 46 (*)    GFR calc Af Amer 53 (*)    All other components within normal limits  URINALYSIS, ROUTINE W REFLEX MICROSCOPIC - Abnormal; Notable for the following:    APPearance CLOUDY (*)    Hgb urine dipstick TRACE (*)    Leukocytes, UA SMALL (*)    All other components within normal limits  CBC  LIPASE, BLOOD  HEPATIC FUNCTION PANEL  URINE MICROSCOPIC-ADD ON  I-STAT TROPOININ, ED  I-STAT TROPOININ, ED    Imaging Review Dg Abd Acute W/chest  02/12/2014   CLINICAL DATA:  Middle lower chest pain for 1 week.  EXAM: ACUTE ABDOMEN SERIES (ABDOMEN 2 VIEW & CHEST 1 VIEW)  COMPARISON:  None.  FINDINGS: There is no evidence of dilated bowel loops or free intraperitoneal air. Moderate bowel content is identified throughout colon. Prior cholecystectomy clips are identified. Heart size and mediastinal contours are within normal limits. Both lungs are clear.  IMPRESSION: No bowel obstruction or free air. Moderate bowel content identified throughout colon. No acute cardiopulmonary disease.   Electronically Signed   By: Abelardo Diesel M.D.   On: 02/12/2014 17:07     EKG  Interpretation   Date/Time:  Saturday February 12 2014 16:01:46 EST Ventricular Rate:  76 PR Interval:  166 QRS Duration: 100 QT Interval:  380 QTC Calculation: 427 R Axis:   78 Text Interpretation:  Sinus rhythm Atrial premature complex Borderline  repolarization abnormality Baseline wander in lead(s) V6 agree. no  significant change from old. Confirmed by Johnney Killian, MD, Jeannie Done [phone]) on  02/12/2014 4:46:34 PM     19:15 patient is well in appearance sitting up in the chair at the bedside. She is hungry and has no complaints. MDM   Final diagnoses:  Other chest pain  Hiatal hernia  Essential hypertension   At this time history is most suggestive of GI etiology. Patient's concern was for possible obstructive type symptoms. At this time there is no evidence of obstruction. She has not had vomiting. Her pain has been waxing and waning in severity. She reports it is  predominantly precipitated and exacerbated when she eats. The patient has a known history of hiatal hernia. I will increase her omeprazole dose to 40 daily and have her take Carafate for the next week. The patient is to follow-up with her family physician this week for recheck. The patient reports she is chronically hypertensive and poorly controlled with systolic blood pressures routinely in the 190s. At this time there does not appear to be any signs of endorgan damage. The patient and her family members report that her blood pressure is always very high and it won't be coming down.     Charlesetta Shanks, MD 02/12/14 [phone]

## 2014-02-18 DIAGNOSIS — R1013 Epigastric pain: Secondary | ICD-10-CM | POA: Diagnosis not present

## 2014-02-18 DIAGNOSIS — N183 Chronic kidney disease, stage 3 (moderate): Secondary | ICD-10-CM | POA: Diagnosis not present

## 2014-02-18 DIAGNOSIS — I129 Hypertensive chronic kidney disease with stage 1 through stage 4 chronic kidney disease, or unspecified chronic kidney disease: Secondary | ICD-10-CM | POA: Diagnosis not present

## 2014-02-21 ENCOUNTER — Other Ambulatory Visit: Payer: Self-pay | Admitting: Geriatric Medicine

## 2014-02-21 DIAGNOSIS — R109 Unspecified abdominal pain: Secondary | ICD-10-CM

## 2014-02-23 ENCOUNTER — Ambulatory Visit
Admission: RE | Admit: 2014-02-23 | Discharge: 2014-02-23 | Disposition: A | Discharge status: Home / Self Care | Payer: Medicare Other | Source: Ambulatory Visit | Attending: Geriatric Medicine | Admitting: Geriatric Medicine

## 2014-02-23 DIAGNOSIS — N281 Cyst of kidney, acquired: Secondary | ICD-10-CM | POA: Diagnosis not present

## 2014-02-23 DIAGNOSIS — R1010 Upper abdominal pain, unspecified: Secondary | ICD-10-CM | POA: Diagnosis not present

## 2014-02-23 DIAGNOSIS — R109 Unspecified abdominal pain: Secondary | ICD-10-CM

## 2014-02-23 DIAGNOSIS — I700 Atherosclerosis of aorta: Secondary | ICD-10-CM | POA: Diagnosis not present

## 2014-02-23 DIAGNOSIS — K5900 Constipation, unspecified: Secondary | ICD-10-CM | POA: Diagnosis not present

## 2014-02-23 MED ORDER — IOHEXOL 300 MG/ML  SOLN
100.0000 mL | Freq: Once | INTRAMUSCULAR | Status: AC | PRN
  Administered 2014-02-23: 100 mL via INTRAVENOUS

## 2014-04-07 DIAGNOSIS — H35353 Cystoid macular degeneration, bilateral: Secondary | ICD-10-CM | POA: Diagnosis not present

## 2014-04-07 DIAGNOSIS — H5213 Myopia, bilateral: Secondary | ICD-10-CM | POA: Diagnosis not present

## 2014-05-13 DIAGNOSIS — I129 Hypertensive chronic kidney disease with stage 1 through stage 4 chronic kidney disease, or unspecified chronic kidney disease: Secondary | ICD-10-CM | POA: Diagnosis not present

## 2014-05-13 DIAGNOSIS — Z79899 Other long term (current) drug therapy: Secondary | ICD-10-CM | POA: Diagnosis not present

## 2014-05-13 DIAGNOSIS — F329 Major depressive disorder, single episode, unspecified: Secondary | ICD-10-CM | POA: Diagnosis not present

## 2014-05-13 DIAGNOSIS — N183 Chronic kidney disease, stage 3 (moderate): Secondary | ICD-10-CM | POA: Diagnosis not present

## 2014-06-03 DIAGNOSIS — N183 Chronic kidney disease, stage 3 (moderate): Secondary | ICD-10-CM | POA: Diagnosis not present

## 2014-06-03 DIAGNOSIS — I129 Hypertensive chronic kidney disease with stage 1 through stage 4 chronic kidney disease, or unspecified chronic kidney disease: Secondary | ICD-10-CM | POA: Diagnosis not present

## 2014-06-03 DIAGNOSIS — R42 Dizziness and giddiness: Secondary | ICD-10-CM | POA: Diagnosis not present

## 2014-06-03 DIAGNOSIS — H6123 Impacted cerumen, bilateral: Secondary | ICD-10-CM | POA: Diagnosis not present

## 2014-09-16 DIAGNOSIS — F325 Major depressive disorder, single episode, in full remission: Secondary | ICD-10-CM | POA: Diagnosis not present

## 2014-09-16 DIAGNOSIS — I129 Hypertensive chronic kidney disease with stage 1 through stage 4 chronic kidney disease, or unspecified chronic kidney disease: Secondary | ICD-10-CM | POA: Diagnosis not present

## 2014-09-16 DIAGNOSIS — Z23 Encounter for immunization: Secondary | ICD-10-CM | POA: Diagnosis not present

## 2014-09-16 DIAGNOSIS — R42 Dizziness and giddiness: Secondary | ICD-10-CM | POA: Diagnosis not present

## 2014-09-16 DIAGNOSIS — N183 Chronic kidney disease, stage 3 (moderate): Secondary | ICD-10-CM | POA: Diagnosis not present

## 2014-10-25 DIAGNOSIS — H6123 Impacted cerumen, bilateral: Secondary | ICD-10-CM | POA: Diagnosis not present

## 2014-10-25 DIAGNOSIS — I10 Essential (primary) hypertension: Secondary | ICD-10-CM | POA: Diagnosis not present

## 2014-10-25 DIAGNOSIS — H903 Sensorineural hearing loss, bilateral: Secondary | ICD-10-CM | POA: Diagnosis not present

## 2014-11-28 DIAGNOSIS — H353123 Nonexudative age-related macular degeneration, left eye, advanced atrophic without subfoveal involvement: Secondary | ICD-10-CM | POA: Diagnosis not present

## 2014-11-28 DIAGNOSIS — H35341 Macular cyst, hole, or pseudohole, right eye: Secondary | ICD-10-CM | POA: Diagnosis not present

## 2014-11-28 DIAGNOSIS — H353213 Exudative age-related macular degeneration, right eye, with inactive scar: Secondary | ICD-10-CM | POA: Diagnosis not present

## 2014-12-30 DIAGNOSIS — H35341 Macular cyst, hole, or pseudohole, right eye: Secondary | ICD-10-CM | POA: Diagnosis not present

## 2014-12-30 DIAGNOSIS — H353213 Exudative age-related macular degeneration, right eye, with inactive scar: Secondary | ICD-10-CM | POA: Diagnosis not present

## 2014-12-30 DIAGNOSIS — H353123 Nonexudative age-related macular degeneration, left eye, advanced atrophic without subfoveal involvement: Secondary | ICD-10-CM | POA: Diagnosis not present

## 2015-01-20 DIAGNOSIS — Z79899 Other long term (current) drug therapy: Secondary | ICD-10-CM | POA: Diagnosis not present

## 2015-01-20 DIAGNOSIS — E559 Vitamin D deficiency, unspecified: Secondary | ICD-10-CM | POA: Diagnosis not present

## 2015-01-20 DIAGNOSIS — I129 Hypertensive chronic kidney disease with stage 1 through stage 4 chronic kidney disease, or unspecified chronic kidney disease: Secondary | ICD-10-CM | POA: Diagnosis not present

## 2015-01-20 DIAGNOSIS — G4700 Insomnia, unspecified: Secondary | ICD-10-CM | POA: Diagnosis not present

## 2015-01-20 DIAGNOSIS — N183 Chronic kidney disease, stage 3 (moderate): Secondary | ICD-10-CM | POA: Diagnosis not present

## 2015-04-10 DIAGNOSIS — L0291 Cutaneous abscess, unspecified: Secondary | ICD-10-CM | POA: Diagnosis not present

## 2015-04-10 DIAGNOSIS — L0201 Cutaneous abscess of face: Secondary | ICD-10-CM | POA: Diagnosis not present

## 2015-04-20 DIAGNOSIS — L739 Follicular disorder, unspecified: Secondary | ICD-10-CM | POA: Diagnosis not present

## 2015-04-20 DIAGNOSIS — D485 Neoplasm of uncertain behavior of skin: Secondary | ICD-10-CM | POA: Diagnosis not present

## 2015-05-10 DIAGNOSIS — L720 Epidermal cyst: Secondary | ICD-10-CM | POA: Diagnosis not present

## 2015-05-12 DIAGNOSIS — R1010 Upper abdominal pain, unspecified: Secondary | ICD-10-CM | POA: Diagnosis not present

## 2015-05-18 DIAGNOSIS — N183 Chronic kidney disease, stage 3 (moderate): Secondary | ICD-10-CM | POA: Diagnosis not present

## 2015-05-18 DIAGNOSIS — R1013 Epigastric pain: Secondary | ICD-10-CM | POA: Diagnosis not present

## 2015-05-18 DIAGNOSIS — F331 Major depressive disorder, recurrent, moderate: Secondary | ICD-10-CM | POA: Diagnosis not present

## 2015-05-18 DIAGNOSIS — L989 Disorder of the skin and subcutaneous tissue, unspecified: Secondary | ICD-10-CM | POA: Diagnosis not present

## 2015-05-18 DIAGNOSIS — I129 Hypertensive chronic kidney disease with stage 1 through stage 4 chronic kidney disease, or unspecified chronic kidney disease: Secondary | ICD-10-CM | POA: Diagnosis not present

## 2015-05-23 ENCOUNTER — Emergency Department (HOSPITAL_COMMUNITY)
Admission: EM | Admit: 2015-05-23 | Discharge: 2015-05-24 | Disposition: A | Discharge status: Home / Self Care | Payer: Medicare Other | Attending: Emergency Medicine | Admitting: Emergency Medicine

## 2015-05-23 ENCOUNTER — Emergency Department (HOSPITAL_COMMUNITY): Payer: Medicare Other

## 2015-05-23 ENCOUNTER — Encounter (HOSPITAL_COMMUNITY): Payer: Self-pay | Admitting: *Deleted

## 2015-05-23 DIAGNOSIS — I10 Essential (primary) hypertension: Secondary | ICD-10-CM | POA: Insufficient documentation

## 2015-05-23 DIAGNOSIS — R109 Unspecified abdominal pain: Secondary | ICD-10-CM | POA: Diagnosis present

## 2015-05-23 DIAGNOSIS — R079 Chest pain, unspecified: Secondary | ICD-10-CM | POA: Insufficient documentation

## 2015-05-23 DIAGNOSIS — R1013 Epigastric pain: Secondary | ICD-10-CM | POA: Insufficient documentation

## 2015-05-23 DIAGNOSIS — N281 Cyst of kidney, acquired: Secondary | ICD-10-CM | POA: Diagnosis not present

## 2015-05-23 DIAGNOSIS — Z79899 Other long term (current) drug therapy: Secondary | ICD-10-CM | POA: Insufficient documentation

## 2015-05-23 DIAGNOSIS — Z7982 Long term (current) use of aspirin: Secondary | ICD-10-CM | POA: Diagnosis not present

## 2015-05-23 DIAGNOSIS — K219 Gastro-esophageal reflux disease without esophagitis: Secondary | ICD-10-CM | POA: Diagnosis not present

## 2015-05-23 DIAGNOSIS — G4700 Insomnia, unspecified: Secondary | ICD-10-CM | POA: Insufficient documentation

## 2015-05-23 LAB — COMPREHENSIVE METABOLIC PANEL
ALBUMIN: 3.5 g/dL (ref 3.5–5.0)
ALK PHOS: 82 U/L (ref 38–126)
ALT: 19 U/L (ref 14–54)
AST: 24 U/L (ref 15–41)
Anion gap: 11 (ref 5–15)
BILIRUBIN TOTAL: 0.6 mg/dL (ref 0.3–1.2)
BUN: 16 mg/dL (ref 6–20)
CALCIUM: 9.1 mg/dL (ref 8.9–10.3)
CO2: 22 mmol/L (ref 22–32)
Chloride: 100 mmol/L — ABNORMAL LOW (ref 101–111)
Creatinine, Ser: 0.81 mg/dL (ref 0.44–1.00)
GFR calc Af Amer: >60 mL/min (ref >60)
GFR calc non Af Amer: >60 mL/min (ref >60)
Glucose, Bld: 108 mg/dL — ABNORMAL HIGH (ref 65–99)
Potassium: 4.9 mmol/L (ref 3.5–5.1)
SODIUM: 133 mmol/L — AB (ref 135–145)
TOTAL PROTEIN: 6.7 g/dL (ref 6.5–8.1)

## 2015-05-23 LAB — CBC
HCT: 36.3 % (ref 36.0–46.0)
Hemoglobin: 12.5 g/dL (ref 12.0–15.0)
MCH: 29.8 pg (ref 26.0–34.0)
MCHC: 34.4 g/dL (ref 30.0–36.0)
MCV: 86.6 fL (ref 78.0–100.0)
Platelets: 306 10*3/uL (ref 150–400)
RBC: 4.19 MIL/uL (ref 3.87–5.11)
RDW: 13.6 % (ref 11.5–15.5)
WBC: 8.4 10*3/uL (ref 4.0–10.5)

## 2015-05-23 LAB — URINALYSIS, ROUTINE W REFLEX MICROSCOPIC
Bilirubin Urine: NEGATIVE
GLUCOSE, UA: NEGATIVE mg/dL
Hgb urine dipstick: NEGATIVE
Ketones, ur: NEGATIVE mg/dL
Nitrite: NEGATIVE
Protein, ur: NEGATIVE mg/dL
Specific Gravity, Urine: 1.007 (ref 1.005–1.030)
pH: 6.5 (ref 5.0–8.0)

## 2015-05-23 LAB — URINE MICROSCOPIC-ADD ON: RBC / HPF: NONE SEEN RBC/hpf (ref 0–5)

## 2015-05-23 LAB — I-STAT TROPONIN, ED: TROPONIN I, POC: 0 ng/mL (ref 0.00–0.08)

## 2015-05-23 LAB — LIPASE, BLOOD: Lipase: 51 U/L (ref 11–51)

## 2015-05-23 MED ORDER — IOPAMIDOL (ISOVUE-300) INJECTION 61%
INTRAVENOUS | Status: AC
  Administered 2015-05-24: 100 mL
  Filled 2015-05-23: qty 100

## 2015-05-23 MED ORDER — SODIUM CHLORIDE 0.9 % IV BOLUS (SEPSIS)
1000.0000 mL | Freq: Once | INTRAVENOUS | Status: AC
  Administered 2015-05-23: 1000 mL via INTRAVENOUS

## 2015-05-23 NOTE — ED Notes (Signed)
Pt c/o epigastric pain & mid CP last night intermittent x 2-3 wks,  worsening last night, pt reports hx of bowel obstruction in the past with similar symptoms, denies n/v/d, reports lethargy & loss of appetite, A&O x4

## 2015-05-24 ENCOUNTER — Emergency Department (HOSPITAL_COMMUNITY): Payer: Medicare Other

## 2015-05-24 ENCOUNTER — Encounter (HOSPITAL_COMMUNITY): Payer: Self-pay | Admitting: Radiology

## 2015-05-24 DIAGNOSIS — N281 Cyst of kidney, acquired: Secondary | ICD-10-CM | POA: Diagnosis not present

## 2015-05-24 DIAGNOSIS — R1013 Epigastric pain: Secondary | ICD-10-CM | POA: Diagnosis not present

## 2015-05-24 NOTE — ED Provider Notes (Signed)
CSN: [id number]     Arrival date & time 05/23/15  1546 History   First MD Initiated Contact with Patient 05/23/15 2152     Chief Complaint  Patient presents with  . Chest Pain  . Abdominal Pain     (Consider location/radiation/quality/duration/timing/severity/associated sxs/prior Treatment) HPI 82 year old female who presents with abdominal pain. History of hypertension, cholecystectomy, and small bowel obstruction. The patient is hard of hearing, and history also obtained from patient's daughter. States that for the past several weeks she has had intermittent epigastric abdominal pain. Typically sharp and burning in nature, nonradiating, and occurs after eating. Pain resolved 1-2 hours after eating. Sometimes pain does radiate into chest from abdomen. No chest pain or shortness of breath. No nausea, vomiting, diarrhea, melena, hematochezia, fever, chills, back pain, flank pain, dysuria, or urinary frequency. No abdominal distension or constipation.  Past Medical History  Diagnosis Date  . Hypertension   . Gastro - esophageal reflux disease   . Insomnia   . Small bowel obstruction ([place])    History reviewed. No pertinent past surgical history. No family history on file. Social History  Substance Use Topics  . Smoking status: Never Smoker   . Smokeless tobacco: None  . Alcohol Use: No   OB History    No data available     Review of Systems 10/14 systems reviewed and are negative other than those stated in the HPI    Allergies  Review of patient's allergies indicates no known allergies.  Home Medications   Prior to Admission medications   Medication Sig Start Date End Date Taking? Authorizing Provider  aspirin 81 MG chewable tablet Chew 81 mg by mouth daily.   Yes Historical Provider, MD  Cholecalciferol (VITAMIN D PO) Take 1 tablet by mouth daily.   Yes Historical Provider, MD  escitalopram (LEXAPRO) 10 MG tablet Take 10 mg by mouth daily.   Yes Historical Provider, MD   loratadine (CLARITIN) 10 MG tablet Take 10 mg by mouth daily.   Yes Historical Provider, MD  LORazepam (ATIVAN) 0.5 MG tablet Take 0.5 mg by mouth 2 (two) times daily as needed for anxiety. For anxiety   Yes Historical Provider, MD  losartan (COZAAR) 50 MG tablet Take 50 mg by mouth 2 (two) times daily.    Yes Historical Provider, MD  Multiple Vitamins-Minerals (PRESERVISION AREDS 2 PO) Take 1 capsule by mouth daily.   Yes Historical Provider, MD  omeprazole (PRILOSEC) 20 MG capsule Take 2 capsules (40 mg total) by mouth daily. 02/12/14  Yes Charlesetta Shanks, MD  polyethylene glycol (MIRALAX / GLYCOLAX) packet Take 17 g by mouth daily as needed for mild constipation.   Yes Historical Provider, MD  triazolam (HALCION) 0.25 MG tablet Take 0.25 mg by mouth at bedtime.    Yes Historical Provider, MD  verapamil (CALAN-SR) 240 MG CR tablet Take 240 mg by mouth 2 (two) times daily.    Yes Historical Provider, MD  amLODipine (NORVASC) 2.5 MG tablet Take 1 tablet (2.5 mg total) by mouth daily. 08/13/11 08/12/12  Calvert Cantor, MD  mirtazapine (REMERON) 7.5 MG tablet Take 1 tablet (7.5 mg total) by mouth at bedtime. Patient not taking: Reported on 02/12/2014 08/13/11 09/12/11  Calvert Cantor, MD   BP 202/85 mmHg  Pulse 64  Temp(Src) 98.4 F (36.9 C) (Oral)  Resp 17  SpO2 95% Physical Exam Physical Exam  Nursing note and vitals reviewed. Constitutional: Well developed, well nourished, non-toxic, and in no acute distress Head: Normocephalic and atraumatic.  Mouth/Throat: Oropharynx is clear and moist.  Neck: Normal range of motion. Neck supple.  Cardiovascular: Normal rate and regular rhythm.   Pulmonary/Chest: Effort normal and breath sounds normal.  Abdominal: Soft. There is no tenderness. There is no rebound and no guarding.  Musculoskeletal: Normal range of motion.  Neurological: Alert, no facial droop, fluent speech, moves all extremities symmetrically Skin: Skin is warm and dry.  Psychiatric:  Cooperative  ED Course  Procedures (including critical care time) Labs Review Labs Reviewed  COMPREHENSIVE METABOLIC PANEL - Abnormal; Notable for the following:    Sodium 133 (*)    Chloride 100 (*)    Glucose, Bld 108 (*)    All other components within normal limits  URINALYSIS, ROUTINE W REFLEX MICROSCOPIC (NOT AT [facility]) - Abnormal; Notable for the following:    Leukocytes, UA SMALL (*)    All other components within normal limits  URINE MICROSCOPIC-ADD ON - Abnormal; Notable for the following:    Squamous Epithelial / LPF 0-5 (*)    Bacteria, UA RARE (*)    All other components within normal limits  LIPASE, BLOOD  CBC  I-STAT TROPOININ, ED    Imaging Review Dg Chest 2 View  05/23/2015  CLINICAL DATA:  Midchest pain for 3 weeks EXAM: CHEST  2 VIEW COMPARISON:  None. FINDINGS: There is mild interstitial fluid or thickening. No alveolar opacity. No effusions. Hilar and mediastinal contours are unremarkable. IMPRESSION: Mild interstitial thickening, more likely chronic. No consolidation. No effusion. Electronically Signed   By: Andreas Newport M.D.   On: 05/23/2015 23:11   Ct Abdomen Pelvis W Contrast  05/24/2015  CLINICAL DATA:  Chronic worsening epigastric abdominal pain. Initial encounter. EXAM: CT ABDOMEN AND PELVIS WITH CONTRAST TECHNIQUE: Multidetector CT imaging of the abdomen and pelvis was performed using the standard protocol following bolus administration of intravenous contrast. CONTRAST:  100 mL ISOVUE-300 IOPAMIDOL (ISOVUE-300) INJECTION 61% COMPARISON:  None. FINDINGS: A small calcified granuloma is noted at the right lung base. A tiny hiatal hernia is noted. Calcification is noted at the mitral and aortic valves. The liver and spleen are unremarkable in appearance. The patient is status cholecystectomy, with clips noted along the gallbladder fossa. The pancreas and adrenal glands are unremarkable. Bilateral renal cysts are noted, measuring up to 5.9 cm in size. A 4.5 cm  right renal cyst demonstrates minimal wall thickening. Bilateral renal pelvicaliectasis remains within normal limits, without significant hydronephrosis. No renal or ureteral stones are identified. No perinephric stranding is appreciated. No free fluid is identified. The small bowel is unremarkable in appearance. The stomach is within normal limits. No acute vascular abnormalities are seen. Relatively diffuse calcification is noted along the abdominal aorta and its branches. The appendix is normal in caliber and contains air, without evidence of appendicitis. Scattered diverticulosis is noted along the sigmoid colon, without evidence of diverticulitis. The bladder is mildly distended and grossly unremarkable. The uterus is unremarkable in appearance. No suspicious adnexal masses are seen. No inguinal lymphadenopathy is seen. No acute osseous abnormalities are identified. There is mild chronic compression deformity at the superior endplate of vertebral body L1. Vacuum phenomenon is noted at L5-S1. IMPRESSION: 1. No acute abnormality seen within the abdomen or pelvis. 2. Tiny hiatal hernia seen. 3. Bilateral renal cysts noted. 4.5 cm right renal cyst demonstrates minimal wall thickening. 4. Relatively diffuse calcification along the abdominal aorta and its branches. 5. Scattered diverticulosis along the sigmoid colon, without evidence of diverticulitis. 6. Mild chronic compression deformity at the  superior endplate of L1. Electronically Signed   By: Garald Balding M.D.   On: 05/24/2015 01:06   I have personally reviewed and evaluated these images and lab results as part of my medical decision-making.   EKG Interpretation   Date/Time:  Tuesday May 23 2015 15:56:14 EDT Ventricular Rate:  82 PR Interval:  136 QRS Duration: 88 QT Interval:  378 QTC Calculation: 441 R Axis:   83 Text Interpretation:  Normal sinus rhythm Cannot rule out Anterior infarct  , age undetermined Abnormal ECG No significant change  since last tracing  Confirmed by Danicia Terhaar MD, Mairim Bade [phone]) on 05/23/2015 10:25:21 PM      MDM   Final diagnoses:  Epigastric abdominal pain   In short this is a 82 year old female who presents with epigastric abdominal pain intermittent in nature. She currently is asymptomatic on my evaluation. Is afebrile, hemodynamically stable, hypertensive. Has a soft and benign abdomen. Her symptoms seems suggestive of likely peptic ulcer disease. Has had cholecystectomy for biliary colic is not an issue. No major risk factors for mesenteric ischemia. Pain localized to epigastrium when she has it, and does not seem like pain associated with that of mesenteric ischemia. Given age, will perform CT abd/pelvis to rule out other potential serious causes. Unremarkable CBC, CMP, lipase, and UA. CT abd/pelvis visualized and without acute intraabdominal processes. Abdomen remains benign. Discussed supportive care with GI referral for potential PUD. Strict return and follow-up instructions reviewed. She expressed understanding of all discharge instructions and felt comfortable with the plan of care.    Forde Dandy, MD 05/24/15 1101

## 2015-05-24 NOTE — Discharge Instructions (Signed)
Your CT scan of the abdomen and pelvis does not show any serious cause of your symptoms today. He will likely have ulcer in her stomach that is causing her symptoms. He has been giving Stroke neurology follow-up listed above for potential workup of this and treatment of this. Return without fail for worsening symptoms, including worsening pain, vomiting unable to keep down food or fluids, black tarry stools or bloody stools, or any other symptoms concerning to you.  Abdominal Pain, Adult Many things can cause abdominal pain. Usually, abdominal pain is not caused by a disease and will improve without treatment. It can often be observed and treated at home. Your health care provider will do a physical exam and possibly order blood tests and X-rays to help determine the seriousness of your pain. However, in many cases, more time must pass before a clear cause of the pain can be found. Before that point, your health care provider may not know if you need more testing or further treatment. HOME CARE INSTRUCTIONS Monitor your abdominal pain for any changes. The following actions may help to alleviate any discomfort you are experiencing:  Only take over-the-counter or prescription medicines as directed by your health care provider.  Do not take laxatives unless directed to do so by your health care provider.  Try a clear liquid diet (broth, tea, or water) as directed by your health care provider. Slowly move to a bland diet as tolerated. SEEK MEDICAL CARE IF:  You have unexplained abdominal pain.  You have abdominal pain associated with nausea or diarrhea.  You have pain when you urinate or have a bowel movement.  You experience abdominal pain that wakes you in the night.  You have abdominal pain that is worsened or improved by eating food.  You have abdominal pain that is worsened with eating fatty foods.  You have a fever. SEEK IMMEDIATE MEDICAL CARE IF:  Your pain does not go away within 2  hours.  You keep throwing up (vomiting).  Your pain is felt only in portions of the abdomen, such as the right side or the left lower portion of the abdomen.  You pass bloody or black tarry stools. MAKE SURE YOU:  Understand these instructions.  Will watch your condition.  Will get help right away if you are not doing well or get worse.   This information is not intended to replace advice given to you by your health care provider. Make sure you discuss any questions you have with your health care provider.   Document Released: 11/07/2004 Document Revised: 10/19/2014 Document Reviewed: 10/07/2012 Elsevier Interactive Patient Education [employer].

## 2015-05-30 DIAGNOSIS — R1010 Upper abdominal pain, unspecified: Secondary | ICD-10-CM | POA: Diagnosis not present

## 2015-06-05 DIAGNOSIS — L720 Epidermal cyst: Secondary | ICD-10-CM | POA: Diagnosis not present

## 2015-06-15 DIAGNOSIS — L720 Epidermal cyst: Secondary | ICD-10-CM | POA: Diagnosis not present

## 2015-07-04 DIAGNOSIS — D485 Neoplasm of uncertain behavior of skin: Secondary | ICD-10-CM | POA: Diagnosis not present

## 2015-07-04 DIAGNOSIS — L923 Foreign body granuloma of the skin and subcutaneous tissue: Secondary | ICD-10-CM | POA: Diagnosis not present

## 2015-07-21 DIAGNOSIS — N183 Chronic kidney disease, stage 3 (moderate): Secondary | ICD-10-CM | POA: Diagnosis not present

## 2015-07-21 DIAGNOSIS — I129 Hypertensive chronic kidney disease with stage 1 through stage 4 chronic kidney disease, or unspecified chronic kidney disease: Secondary | ICD-10-CM | POA: Diagnosis not present

## 2015-07-21 DIAGNOSIS — F325 Major depressive disorder, single episode, in full remission: Secondary | ICD-10-CM | POA: Diagnosis not present

## 2015-08-29 DIAGNOSIS — H353213 Exudative age-related macular degeneration, right eye, with inactive scar: Secondary | ICD-10-CM | POA: Diagnosis not present

## 2015-08-29 DIAGNOSIS — H35341 Macular cyst, hole, or pseudohole, right eye: Secondary | ICD-10-CM | POA: Diagnosis not present

## 2015-08-29 DIAGNOSIS — H43813 Vitreous degeneration, bilateral: Secondary | ICD-10-CM | POA: Diagnosis not present

## 2015-08-29 DIAGNOSIS — H353123 Nonexudative age-related macular degeneration, left eye, advanced atrophic without subfoveal involvement: Secondary | ICD-10-CM | POA: Diagnosis not present

## 2015-09-14 DIAGNOSIS — L729 Follicular cyst of the skin and subcutaneous tissue, unspecified: Secondary | ICD-10-CM | POA: Diagnosis not present

## 2015-09-28 DIAGNOSIS — L03211 Cellulitis of face: Secondary | ICD-10-CM | POA: Diagnosis not present

## 2015-09-28 DIAGNOSIS — L723 Sebaceous cyst: Secondary | ICD-10-CM | POA: Diagnosis not present

## 2015-09-28 DIAGNOSIS — L0390 Cellulitis, unspecified: Secondary | ICD-10-CM | POA: Diagnosis not present

## 2015-10-13 DIAGNOSIS — L720 Epidermal cyst: Secondary | ICD-10-CM | POA: Diagnosis not present

## 2015-11-14 DIAGNOSIS — Z23 Encounter for immunization: Secondary | ICD-10-CM | POA: Diagnosis not present

## 2015-12-20 ENCOUNTER — Emergency Department (HOSPITAL_COMMUNITY)
Admission: EM | Admit: 2015-12-20 | Discharge: 2015-12-20 | Disposition: A | Discharge status: Home / Self Care | Payer: Medicare Other | Attending: Emergency Medicine | Admitting: Emergency Medicine

## 2015-12-20 ENCOUNTER — Encounter (HOSPITAL_COMMUNITY): Payer: Self-pay

## 2015-12-20 DIAGNOSIS — I10 Essential (primary) hypertension: Secondary | ICD-10-CM | POA: Diagnosis not present

## 2015-12-20 DIAGNOSIS — K91840 Postprocedural hemorrhage and hematoma of a digestive system organ or structure following a digestive system procedure: Secondary | ICD-10-CM | POA: Diagnosis not present

## 2015-12-20 DIAGNOSIS — K91841 Postprocedural hemorrhage and hematoma of a digestive system organ or structure following other procedure: Secondary | ICD-10-CM | POA: Diagnosis not present

## 2015-12-20 DIAGNOSIS — Z79899 Other long term (current) drug therapy: Secondary | ICD-10-CM | POA: Diagnosis not present

## 2015-12-20 DIAGNOSIS — Z7982 Long term (current) use of aspirin: Secondary | ICD-10-CM | POA: Insufficient documentation

## 2015-12-20 DIAGNOSIS — K1379 Other lesions of oral mucosa: Secondary | ICD-10-CM

## 2015-12-20 MED ORDER — "TRANEXAMIC ACID 5% ORAL SOLUTION "
10.0000 mL | Freq: Once | ORAL | Status: DC
  Filled 2015-12-20: qty 10

## 2015-12-20 NOTE — ED Notes (Signed)
No active bleeding after rinsing mouth with ice water. Pt alert and oriented x 4 with family at bedside to transport home.

## 2015-12-20 NOTE — ED Notes (Signed)
Pt instructed to swish and spit ice water

## 2015-12-20 NOTE — ED Provider Notes (Signed)
[facility] DEPT Provider Note   CSN: [id number] Arrival date & time: 12/20/15  0131  By signing my name below, I, Gwenlyn Fudge, attest that this documentation has been prepared under the direction and in the presence of Merryl Hacker, MD. Electronically Signed: Gwenlyn Fudge, ED Scribe. 12/20/15. 2:05 AM.   History   Chief Complaint Chief Complaint  Patient presents with  . Dental Injury   The history is provided by the patient and a relative. No language interpreter was used.   HPI Comments: Julie Oliver is a 82 y.o. female who presents to the Emergency Department complaining of gradual onset, constant dental bleeding onset 10:30 PM. Pt had an ingrown tooth that became infected. Pt had tooth removed 2 weeks ago, and last week she had all but 2 stitches removed. She began bleeding from the surgical site earlier tonight and she placed a wet tea bag to control bleeding. Pt has noticed small clots along with bleeding. She has tried salt water swish with no relief. Pt takes 81 mg Aspirin daily.  Past Medical History:  Diagnosis Date  . Gastro - esophageal reflux disease   . Hypertension   . Insomnia   . Small bowel obstruction     There are no active problems to display for this patient.   Past Surgical History:  Procedure Laterality Date  . MOUTH SURGERY      OB History    No data available       Home Medications    Prior to Admission medications   Medication Sig Start Date End Date Taking? Authorizing Provider  aspirin 81 MG chewable tablet Chew 81 mg by mouth daily.   Yes Historical Provider, MD  escitalopram (LEXAPRO) 10 MG tablet Take 10 mg by mouth daily.   Yes Historical Provider, MD  loratadine (CLARITIN) 10 MG tablet Take 10 mg by mouth daily.   Yes Historical Provider, MD  losartan (COZAAR) 50 MG tablet Take 100 mg by mouth daily.    Yes Historical Provider, MD  omeprazole (PRILOSEC) 20 MG capsule Take 2 capsules (40 mg total) by mouth daily. 02/12/14  Yes  Charlesetta Shanks, MD  polyethylene glycol (MIRALAX / GLYCOLAX) packet Take 17 g by mouth daily as needed for mild constipation.   Yes Historical Provider, MD  verapamil (CALAN-SR) 240 MG CR tablet Take 240 mg by mouth 2 (two) times daily.    Yes Historical Provider, MD  amLODipine (NORVASC) 2.5 MG tablet Take 1 tablet (2.5 mg total) by mouth daily. 08/13/11 08/12/12  Calvert Cantor, MD  mirtazapine (REMERON) 7.5 MG tablet Take 1 tablet (7.5 mg total) by mouth at bedtime. Patient not taking: Reported on 02/12/2014 08/13/11 09/12/11  Calvert Cantor, MD    Family History History reviewed. No pertinent family history.  Social History Social History  Substance Use Topics  . Smoking status: Never Smoker  . Smokeless tobacco: Never Used  . Alcohol use No     Allergies   Patient has no known allergies.   Review of Systems Review of Systems  Constitutional: Negative for fever.  HENT: Positive for dental problem. Negative for nosebleeds.   All other systems reviewed and are negative.    Physical Exam Updated Vital Signs BP 191/91 (BP Location: Left Arm)   Pulse 70   Temp 98.2 F (36.8 C) (Oral)   Resp 18   Ht 5\' 1"  (1.549 m)   Wt 124 lb (56.2 kg)   SpO2 96%   BMI 23.43 kg/m  Physical Exam  Constitutional: She is oriented to person, place, and time. She appears well-developed and well-nourished. No distress.  Elderly  HENT:  Head: Normocephalic and atraumatic.  Mouth/Throat: Oropharynx is clear and moist. No oropharyngeal exudate.  Oozing noted from suture line over the medial aspect of the left gumline, edentulous upper gum, no significant swelling, no other bleeding noted  Cardiovascular: Normal rate and regular rhythm.   Pulmonary/Chest: Effort normal. No respiratory distress.  Abdominal: There is tenderness.  Neurological: She is alert and oriented to person, place, and time.  Skin: Skin is warm and dry.  Psychiatric: She has a normal mood and affect.  Nursing note and  vitals reviewed.    ED Treatments / Results  DIAGNOSTIC STUDIES: Oxygen Saturation is 97% on RA, adequate by my interpretation.    COORDINATION OF CARE: 2:02 AM Discussed treatment plan with pt at bedside which includes ice water swish and tranexamic oral solution and pt agreed to plan.  Labs (all labs ordered are listed, but only abnormal results are displayed) Labs Reviewed - No data to display  EKG  EKG Interpretation None      Radiology No results found.  Procedures Procedures (including critical care time)  Medications Ordered in ED Medications  tranexamic acid 5% oral solution for E.D. (10 mLs Mouth/Throat Not Given 12/20/15 0304)     Initial Impression / Assessment and Plan / ED Course  I have reviewed the triage vital signs and the nursing notes.  Pertinent labs & imaging results that were available during my care of the patient were reviewed by me and considered in my medical decision making (see chart for details).  Clinical Course    Patient presents with bleeding from oral surgery site. Unable to contain bleeding at home. She takes a baby aspirin daily. Mild oozing noted from suture line.  She is otherwise nontoxic. Will have patient swish TX A. In the meantime, she was given ice water to swish and spit.  3:07 AM On repeat examination, bleeding has stopped. Patient has not received TXA at this point. No oozing noted. Patient had been wearing her upper dentures at night because of a mouth tremor that she has that keeps her from sleeping because her bottom teeth hit her upper gum. Discussed with her that the upper denture may be causing some of the trauma to the oral surgery site. However, if she has tolerated them previously, she can continue to wear them. Follow-up with oral surgery. Recommend soft diet.  After history, exam, and medical workup I feel the patient has been appropriately medically screened and is safe for discharge home. Pertinent diagnoses were  discussed with the patient. Patient was given return precautions.   Final Clinical Impressions(s) / ED Diagnoses   Final diagnoses:  Oral bleeding    New Prescriptions New Prescriptions   No medications on file     Merryl Hacker, MD 12/20/15 [phone]

## 2015-12-20 NOTE — ED Triage Notes (Signed)
Pt had a tooth removed about two weeks ago, she was doing fine and tonight she started bleeding at the site, the oral surgeon, Olin Hauser, told her to place a tea bag on the area and see if it would clot, the bleeding intially wouldn't stop. Pt had her false teeth in when she came to the ED

## 2015-12-20 NOTE — Discharge Instructions (Signed)
You were seen today for oral bleeding. Your bleeding stopped with supportive measures. Avoid dental trauma. Maintain a soft diet until well healed. Follow-up with your oral surgeon.

## 2016-01-23 DIAGNOSIS — H353213 Exudative age-related macular degeneration, right eye, with inactive scar: Secondary | ICD-10-CM | POA: Diagnosis not present

## 2016-02-27 DIAGNOSIS — I129 Hypertensive chronic kidney disease with stage 1 through stage 4 chronic kidney disease, or unspecified chronic kidney disease: Secondary | ICD-10-CM | POA: Diagnosis not present

## 2016-02-27 DIAGNOSIS — F325 Major depressive disorder, single episode, in full remission: Secondary | ICD-10-CM | POA: Diagnosis not present

## 2016-02-27 DIAGNOSIS — N183 Chronic kidney disease, stage 3 (moderate): Secondary | ICD-10-CM | POA: Diagnosis not present

## 2016-02-27 DIAGNOSIS — Z79899 Other long term (current) drug therapy: Secondary | ICD-10-CM | POA: Diagnosis not present

## 2016-02-27 DIAGNOSIS — E559 Vitamin D deficiency, unspecified: Secondary | ICD-10-CM | POA: Diagnosis not present

## 2016-03-15 DIAGNOSIS — H353221 Exudative age-related macular degeneration, left eye, with active choroidal neovascularization: Secondary | ICD-10-CM | POA: Diagnosis not present

## 2016-03-15 DIAGNOSIS — H353213 Exudative age-related macular degeneration, right eye, with inactive scar: Secondary | ICD-10-CM | POA: Diagnosis not present

## 2016-03-15 DIAGNOSIS — H43813 Vitreous degeneration, bilateral: Secondary | ICD-10-CM | POA: Diagnosis not present

## 2016-04-12 DIAGNOSIS — H43813 Vitreous degeneration, bilateral: Secondary | ICD-10-CM | POA: Diagnosis not present

## 2016-04-12 DIAGNOSIS — H35341 Macular cyst, hole, or pseudohole, right eye: Secondary | ICD-10-CM | POA: Diagnosis not present

## 2016-04-12 DIAGNOSIS — H353213 Exudative age-related macular degeneration, right eye, with inactive scar: Secondary | ICD-10-CM | POA: Diagnosis not present

## 2016-04-12 DIAGNOSIS — H353221 Exudative age-related macular degeneration, left eye, with active choroidal neovascularization: Secondary | ICD-10-CM | POA: Diagnosis not present

## 2016-06-05 DIAGNOSIS — R1010 Upper abdominal pain, unspecified: Secondary | ICD-10-CM | POA: Diagnosis not present

## 2016-06-05 DIAGNOSIS — K591 Functional diarrhea: Secondary | ICD-10-CM | POA: Diagnosis not present

## 2016-06-05 DIAGNOSIS — R5383 Other fatigue: Secondary | ICD-10-CM | POA: Diagnosis not present

## 2016-06-12 DIAGNOSIS — H35341 Macular cyst, hole, or pseudohole, right eye: Secondary | ICD-10-CM | POA: Diagnosis not present

## 2016-06-12 DIAGNOSIS — H353213 Exudative age-related macular degeneration, right eye, with inactive scar: Secondary | ICD-10-CM | POA: Diagnosis not present

## 2016-06-12 DIAGNOSIS — H43813 Vitreous degeneration, bilateral: Secondary | ICD-10-CM | POA: Diagnosis not present

## 2016-06-12 DIAGNOSIS — H353221 Exudative age-related macular degeneration, left eye, with active choroidal neovascularization: Secondary | ICD-10-CM | POA: Diagnosis not present

## 2016-08-28 DIAGNOSIS — H353221 Exudative age-related macular degeneration, left eye, with active choroidal neovascularization: Secondary | ICD-10-CM | POA: Diagnosis not present

## 2016-08-28 DIAGNOSIS — H353213 Exudative age-related macular degeneration, right eye, with inactive scar: Secondary | ICD-10-CM | POA: Diagnosis not present

## 2016-08-28 DIAGNOSIS — H35341 Macular cyst, hole, or pseudohole, right eye: Secondary | ICD-10-CM | POA: Diagnosis not present

## 2016-08-28 DIAGNOSIS — H43813 Vitreous degeneration, bilateral: Secondary | ICD-10-CM | POA: Diagnosis not present

## 2016-09-04 DIAGNOSIS — N183 Chronic kidney disease, stage 3 (moderate): Secondary | ICD-10-CM | POA: Diagnosis not present

## 2016-09-04 DIAGNOSIS — R030 Elevated blood-pressure reading, without diagnosis of hypertension: Secondary | ICD-10-CM | POA: Diagnosis not present

## 2016-09-04 DIAGNOSIS — I129 Hypertensive chronic kidney disease with stage 1 through stage 4 chronic kidney disease, or unspecified chronic kidney disease: Secondary | ICD-10-CM | POA: Diagnosis not present

## 2016-09-04 DIAGNOSIS — D649 Anemia, unspecified: Secondary | ICD-10-CM | POA: Diagnosis not present

## 2016-09-04 DIAGNOSIS — Z0000 Encounter for general adult medical examination without abnormal findings: Secondary | ICD-10-CM | POA: Diagnosis not present

## 2016-09-04 DIAGNOSIS — E559 Vitamin D deficiency, unspecified: Secondary | ICD-10-CM | POA: Diagnosis not present

## 2016-09-04 DIAGNOSIS — Z79899 Other long term (current) drug therapy: Secondary | ICD-10-CM | POA: Diagnosis not present

## 2016-09-04 DIAGNOSIS — K219 Gastro-esophageal reflux disease without esophagitis: Secondary | ICD-10-CM | POA: Diagnosis not present

## 2016-09-04 DIAGNOSIS — F325 Major depressive disorder, single episode, in full remission: Secondary | ICD-10-CM | POA: Diagnosis not present

## 2016-09-04 DIAGNOSIS — G250 Essential tremor: Secondary | ICD-10-CM | POA: Diagnosis not present

## 2016-11-12 DIAGNOSIS — Z23 Encounter for immunization: Secondary | ICD-10-CM | POA: Diagnosis not present

## 2016-11-26 DIAGNOSIS — H353221 Exudative age-related macular degeneration, left eye, with active choroidal neovascularization: Secondary | ICD-10-CM | POA: Diagnosis not present

## 2016-11-26 DIAGNOSIS — H43813 Vitreous degeneration, bilateral: Secondary | ICD-10-CM | POA: Diagnosis not present

## 2016-11-26 DIAGNOSIS — H353213 Exudative age-related macular degeneration, right eye, with inactive scar: Secondary | ICD-10-CM | POA: Diagnosis not present

## 2016-11-26 DIAGNOSIS — H35341 Macular cyst, hole, or pseudohole, right eye: Secondary | ICD-10-CM | POA: Diagnosis not present

## 2017-02-21 DIAGNOSIS — N390 Urinary tract infection, site not specified: Secondary | ICD-10-CM | POA: Diagnosis not present

## 2017-02-26 DIAGNOSIS — I129 Hypertensive chronic kidney disease with stage 1 through stage 4 chronic kidney disease, or unspecified chronic kidney disease: Secondary | ICD-10-CM | POA: Diagnosis not present

## 2017-02-26 DIAGNOSIS — F325 Major depressive disorder, single episode, in full remission: Secondary | ICD-10-CM | POA: Diagnosis not present

## 2017-02-26 DIAGNOSIS — N183 Chronic kidney disease, stage 3 (moderate): Secondary | ICD-10-CM | POA: Diagnosis not present

## 2017-02-26 DIAGNOSIS — R05 Cough: Secondary | ICD-10-CM | POA: Diagnosis not present

## 2017-02-26 DIAGNOSIS — Z79899 Other long term (current) drug therapy: Secondary | ICD-10-CM | POA: Diagnosis not present

## 2017-02-27 ENCOUNTER — Ambulatory Visit
Admission: RE | Admit: 2017-02-27 | Discharge: 2017-02-27 | Disposition: A | Discharge status: Home / Self Care | Payer: Medicare Other | Source: Ambulatory Visit | Attending: Geriatric Medicine | Admitting: Geriatric Medicine

## 2017-02-27 ENCOUNTER — Other Ambulatory Visit: Payer: Self-pay | Admitting: Geriatric Medicine

## 2017-02-27 DIAGNOSIS — R059 Cough, unspecified: Secondary | ICD-10-CM

## 2017-02-27 DIAGNOSIS — R05 Cough: Secondary | ICD-10-CM | POA: Diagnosis not present

## 2017-03-05 DIAGNOSIS — R3915 Urgency of urination: Secondary | ICD-10-CM | POA: Diagnosis not present

## 2017-03-11 ENCOUNTER — Other Ambulatory Visit: Payer: Self-pay | Admitting: Geriatric Medicine

## 2017-03-11 DIAGNOSIS — R269 Unspecified abnormalities of gait and mobility: Secondary | ICD-10-CM

## 2017-03-11 DIAGNOSIS — N183 Chronic kidney disease, stage 3 (moderate): Secondary | ICD-10-CM | POA: Diagnosis not present

## 2017-03-11 DIAGNOSIS — I129 Hypertensive chronic kidney disease with stage 1 through stage 4 chronic kidney disease, or unspecified chronic kidney disease: Secondary | ICD-10-CM | POA: Diagnosis not present

## 2017-03-11 DIAGNOSIS — F331 Major depressive disorder, recurrent, moderate: Secondary | ICD-10-CM | POA: Diagnosis not present

## 2017-03-11 DIAGNOSIS — R413 Other amnesia: Secondary | ICD-10-CM | POA: Diagnosis not present

## 2017-03-15 ENCOUNTER — Ambulatory Visit
Admission: RE | Admit: 2017-03-15 | Discharge: 2017-03-15 | Disposition: A | Discharge status: Home / Self Care | Payer: Medicare Other | Source: Ambulatory Visit | Attending: Geriatric Medicine | Admitting: Geriatric Medicine

## 2017-03-15 DIAGNOSIS — I639 Cerebral infarction, unspecified: Secondary | ICD-10-CM | POA: Diagnosis not present

## 2017-03-15 DIAGNOSIS — R269 Unspecified abnormalities of gait and mobility: Secondary | ICD-10-CM

## 2017-03-15 MED ORDER — GADOBENATE DIMEGLUMINE 529 MG/ML IV SOLN
12.0000 mL | Freq: Once | INTRAVENOUS | Status: AC | PRN
  Administered 2017-03-15: 12 mL via INTRAVENOUS

## 2017-03-18 DIAGNOSIS — H903 Sensorineural hearing loss, bilateral: Secondary | ICD-10-CM | POA: Diagnosis not present

## 2017-03-18 DIAGNOSIS — I10 Essential (primary) hypertension: Secondary | ICD-10-CM | POA: Diagnosis not present

## 2017-03-18 DIAGNOSIS — H6123 Impacted cerumen, bilateral: Secondary | ICD-10-CM | POA: Diagnosis not present

## 2017-04-08 DIAGNOSIS — H43813 Vitreous degeneration, bilateral: Secondary | ICD-10-CM | POA: Diagnosis not present

## 2017-04-08 DIAGNOSIS — H353222 Exudative age-related macular degeneration, left eye, with inactive choroidal neovascularization: Secondary | ICD-10-CM | POA: Diagnosis not present

## 2017-04-08 DIAGNOSIS — H35341 Macular cyst, hole, or pseudohole, right eye: Secondary | ICD-10-CM | POA: Diagnosis not present

## 2017-04-08 DIAGNOSIS — H353213 Exudative age-related macular degeneration, right eye, with inactive scar: Secondary | ICD-10-CM | POA: Diagnosis not present

## 2017-04-23 DIAGNOSIS — R413 Other amnesia: Secondary | ICD-10-CM | POA: Diagnosis not present

## 2017-04-23 DIAGNOSIS — I129 Hypertensive chronic kidney disease with stage 1 through stage 4 chronic kidney disease, or unspecified chronic kidney disease: Secondary | ICD-10-CM | POA: Diagnosis not present

## 2017-04-23 DIAGNOSIS — N183 Chronic kidney disease, stage 3 (moderate): Secondary | ICD-10-CM | POA: Diagnosis not present

## 2017-04-23 DIAGNOSIS — R350 Frequency of micturition: Secondary | ICD-10-CM | POA: Diagnosis not present

## 2017-06-06 DIAGNOSIS — R410 Disorientation, unspecified: Secondary | ICD-10-CM | POA: Diagnosis not present

## 2017-06-28 ENCOUNTER — Inpatient Hospital Stay (HOSPITAL_COMMUNITY)
Admission: EM | Admit: 2017-06-28 | Discharge: 2017-07-01 | DRG: 884 | Disposition: A | Discharge status: Hospice | Payer: Medicare Other | Attending: Internal Medicine | Admitting: Internal Medicine

## 2017-06-28 ENCOUNTER — Encounter (HOSPITAL_COMMUNITY): Payer: Self-pay | Admitting: Emergency Medicine

## 2017-06-28 DIAGNOSIS — Z7982 Long term (current) use of aspirin: Secondary | ICD-10-CM

## 2017-06-28 DIAGNOSIS — R402441 Other coma, without documented Glasgow coma scale score, or with partial score reported, in the field [EMT or ambulance]: Secondary | ICD-10-CM | POA: Diagnosis not present

## 2017-06-28 DIAGNOSIS — K219 Gastro-esophageal reflux disease without esophagitis: Secondary | ICD-10-CM | POA: Diagnosis present

## 2017-06-28 DIAGNOSIS — Z66 Do not resuscitate: Secondary | ICD-10-CM | POA: Diagnosis present

## 2017-06-28 DIAGNOSIS — R4182 Altered mental status, unspecified: Secondary | ICD-10-CM | POA: Diagnosis not present

## 2017-06-28 DIAGNOSIS — F329 Major depressive disorder, single episode, unspecified: Secondary | ICD-10-CM | POA: Diagnosis present

## 2017-06-28 DIAGNOSIS — F1320 Sedative, hypnotic or anxiolytic dependence, uncomplicated: Secondary | ICD-10-CM | POA: Diagnosis present

## 2017-06-28 DIAGNOSIS — R441 Visual hallucinations: Secondary | ICD-10-CM | POA: Diagnosis present

## 2017-06-28 DIAGNOSIS — N179 Acute kidney failure, unspecified: Secondary | ICD-10-CM

## 2017-06-28 DIAGNOSIS — Z8673 Personal history of transient ischemic attack (TIA), and cerebral infarction without residual deficits: Secondary | ICD-10-CM

## 2017-06-28 DIAGNOSIS — F0150 Vascular dementia without behavioral disturbance: Secondary | ICD-10-CM | POA: Diagnosis not present

## 2017-06-28 DIAGNOSIS — R627 Adult failure to thrive: Secondary | ICD-10-CM | POA: Diagnosis present

## 2017-06-28 DIAGNOSIS — N390 Urinary tract infection, site not specified: Secondary | ICD-10-CM | POA: Diagnosis not present

## 2017-06-28 DIAGNOSIS — E860 Dehydration: Secondary | ICD-10-CM | POA: Diagnosis present

## 2017-06-28 DIAGNOSIS — I10 Essential (primary) hypertension: Secondary | ICD-10-CM | POA: Diagnosis present

## 2017-06-28 DIAGNOSIS — R410 Disorientation, unspecified: Secondary | ICD-10-CM | POA: Diagnosis present

## 2017-06-28 DIAGNOSIS — R112 Nausea with vomiting, unspecified: Secondary | ICD-10-CM

## 2017-06-28 DIAGNOSIS — R8271 Bacteriuria: Secondary | ICD-10-CM | POA: Diagnosis present

## 2017-06-28 DIAGNOSIS — Z515 Encounter for palliative care: Secondary | ICD-10-CM | POA: Diagnosis present

## 2017-06-28 DIAGNOSIS — R32 Unspecified urinary incontinence: Secondary | ICD-10-CM | POA: Diagnosis present

## 2017-06-28 DIAGNOSIS — Z79899 Other long term (current) drug therapy: Secondary | ICD-10-CM

## 2017-06-28 DIAGNOSIS — J189 Pneumonia, unspecified organism: Secondary | ICD-10-CM

## 2017-06-28 DIAGNOSIS — J90 Pleural effusion, not elsewhere classified: Secondary | ICD-10-CM | POA: Diagnosis present

## 2017-06-28 DIAGNOSIS — Z87440 Personal history of urinary (tract) infections: Secondary | ICD-10-CM

## 2017-06-28 DIAGNOSIS — G4700 Insomnia, unspecified: Secondary | ICD-10-CM | POA: Diagnosis present

## 2017-06-28 DIAGNOSIS — K567 Ileus, unspecified: Secondary | ICD-10-CM

## 2017-06-28 MED ORDER — SODIUM CHLORIDE 0.9 % IV SOLN
1000.0000 mL | INTRAVENOUS | Status: DC
  Administered 2017-06-29: 1000 mL via INTRAVENOUS

## 2017-06-28 NOTE — ED Triage Notes (Signed)
Per EMS pt from [facility], called for AMS A+Ox1, last seen normal 2 days ago, yesterday pt lethargic and weak. TIA in December without deficits, Cannot hear or see well. Frequent UTI's, pt was on antibiotic. R eye dark yellow drainage. BP 220/70, CBG 107, HR 82, 95% room air, also mass on mid abdomen

## 2017-06-29 ENCOUNTER — Emergency Department (HOSPITAL_COMMUNITY): Payer: Medicare Other

## 2017-06-29 ENCOUNTER — Other Ambulatory Visit: Payer: Self-pay

## 2017-06-29 ENCOUNTER — Inpatient Hospital Stay (HOSPITAL_COMMUNITY): Payer: Medicare Other

## 2017-06-29 DIAGNOSIS — R441 Visual hallucinations: Secondary | ICD-10-CM | POA: Diagnosis present

## 2017-06-29 DIAGNOSIS — N179 Acute kidney failure, unspecified: Secondary | ICD-10-CM

## 2017-06-29 DIAGNOSIS — F329 Major depressive disorder, single episode, unspecified: Secondary | ICD-10-CM | POA: Diagnosis present

## 2017-06-29 DIAGNOSIS — I10 Essential (primary) hypertension: Secondary | ICD-10-CM | POA: Diagnosis not present

## 2017-06-29 DIAGNOSIS — R8271 Bacteriuria: Secondary | ICD-10-CM | POA: Diagnosis present

## 2017-06-29 DIAGNOSIS — R4182 Altered mental status, unspecified: Secondary | ICD-10-CM | POA: Diagnosis present

## 2017-06-29 DIAGNOSIS — G4700 Insomnia, unspecified: Secondary | ICD-10-CM | POA: Diagnosis present

## 2017-06-29 DIAGNOSIS — Z7401 Bed confinement status: Secondary | ICD-10-CM | POA: Diagnosis not present

## 2017-06-29 DIAGNOSIS — F1320 Sedative, hypnotic or anxiolytic dependence, uncomplicated: Secondary | ICD-10-CM | POA: Diagnosis present

## 2017-06-29 DIAGNOSIS — J189 Pneumonia, unspecified organism: Secondary | ICD-10-CM | POA: Diagnosis not present

## 2017-06-29 DIAGNOSIS — N390 Urinary tract infection, site not specified: Secondary | ICD-10-CM | POA: Diagnosis not present

## 2017-06-29 DIAGNOSIS — K567 Ileus, unspecified: Secondary | ICD-10-CM | POA: Diagnosis not present

## 2017-06-29 DIAGNOSIS — R404 Transient alteration of awareness: Secondary | ICD-10-CM

## 2017-06-29 DIAGNOSIS — R1310 Dysphagia, unspecified: Secondary | ICD-10-CM | POA: Diagnosis not present

## 2017-06-29 DIAGNOSIS — R627 Adult failure to thrive: Secondary | ICD-10-CM | POA: Diagnosis present

## 2017-06-29 DIAGNOSIS — Z8673 Personal history of transient ischemic attack (TIA), and cerebral infarction without residual deficits: Secondary | ICD-10-CM | POA: Diagnosis not present

## 2017-06-29 DIAGNOSIS — M2550 Pain in unspecified joint: Secondary | ICD-10-CM | POA: Diagnosis not present

## 2017-06-29 DIAGNOSIS — Z515 Encounter for palliative care: Secondary | ICD-10-CM | POA: Diagnosis not present

## 2017-06-29 DIAGNOSIS — F0150 Vascular dementia without behavioral disturbance: Secondary | ICD-10-CM | POA: Diagnosis present

## 2017-06-29 DIAGNOSIS — R112 Nausea with vomiting, unspecified: Secondary | ICD-10-CM | POA: Diagnosis not present

## 2017-06-29 DIAGNOSIS — E860 Dehydration: Secondary | ICD-10-CM | POA: Diagnosis present

## 2017-06-29 DIAGNOSIS — K219 Gastro-esophageal reflux disease without esophagitis: Secondary | ICD-10-CM | POA: Diagnosis present

## 2017-06-29 DIAGNOSIS — Z87440 Personal history of urinary (tract) infections: Secondary | ICD-10-CM | POA: Diagnosis not present

## 2017-06-29 DIAGNOSIS — I672 Cerebral atherosclerosis: Secondary | ICD-10-CM | POA: Diagnosis not present

## 2017-06-29 DIAGNOSIS — I679 Cerebrovascular disease, unspecified: Secondary | ICD-10-CM | POA: Diagnosis not present

## 2017-06-29 DIAGNOSIS — Z7982 Long term (current) use of aspirin: Secondary | ICD-10-CM | POA: Diagnosis not present

## 2017-06-29 DIAGNOSIS — R32 Unspecified urinary incontinence: Secondary | ICD-10-CM | POA: Diagnosis present

## 2017-06-29 DIAGNOSIS — R410 Disorientation, unspecified: Secondary | ICD-10-CM | POA: Diagnosis present

## 2017-06-29 DIAGNOSIS — Z79899 Other long term (current) drug therapy: Secondary | ICD-10-CM | POA: Diagnosis not present

## 2017-06-29 DIAGNOSIS — J90 Pleural effusion, not elsewhere classified: Secondary | ICD-10-CM | POA: Diagnosis not present

## 2017-06-29 DIAGNOSIS — F339 Major depressive disorder, recurrent, unspecified: Secondary | ICD-10-CM | POA: Diagnosis not present

## 2017-06-29 DIAGNOSIS — Z66 Do not resuscitate: Secondary | ICD-10-CM | POA: Diagnosis present

## 2017-06-29 LAB — COMPREHENSIVE METABOLIC PANEL
ALT: 19 U/L (ref 14–54)
ANION GAP: 8 (ref 5–15)
AST: 25 U/L (ref 15–41)
Albumin: 3 g/dL — ABNORMAL LOW (ref 3.5–5.0)
Alkaline Phosphatase: 91 U/L (ref 38–126)
BILIRUBIN TOTAL: 0.5 mg/dL (ref 0.3–1.2)
BUN: 21 mg/dL — ABNORMAL HIGH (ref 6–20)
CO2: 26 mmol/L (ref 22–32)
Calcium: 8.1 mg/dL — ABNORMAL LOW (ref 8.9–10.3)
Chloride: 106 mmol/L (ref 101–111)
Creatinine, Ser: 1.34 mg/dL — ABNORMAL HIGH (ref 0.44–1.00)
GFR, EST AFRICAN AMERICAN: 38 mL/min — AB (ref >60)
GFR, EST NON AFRICAN AMERICAN: 33 mL/min — AB (ref >60)
Glucose, Bld: 118 mg/dL — ABNORMAL HIGH (ref 65–99)
POTASSIUM: 4.2 mmol/L (ref 3.5–5.1)
Sodium: 140 mmol/L (ref 135–145)
TOTAL PROTEIN: 6 g/dL — AB (ref 6.5–8.1)

## 2017-06-29 LAB — CBC
HEMATOCRIT: 36.7 % (ref 36.0–46.0)
Hemoglobin: 11.9 g/dL — ABNORMAL LOW (ref 12.0–15.0)
MCH: 29.8 pg (ref 26.0–34.0)
MCHC: 32.4 g/dL (ref 30.0–36.0)
MCV: 92 fL (ref 78.0–100.0)
Platelets: 201 10*3/uL (ref 150–400)
RBC: 3.99 MIL/uL (ref 3.87–5.11)
RDW: 13.5 % (ref 11.5–15.5)
WBC: 12.9 10*3/uL — ABNORMAL HIGH (ref 4.0–10.5)

## 2017-06-29 LAB — URINALYSIS, ROUTINE W REFLEX MICROSCOPIC
BILIRUBIN URINE: NEGATIVE
Bacteria, UA: NONE SEEN
GLUCOSE, UA: NEGATIVE mg/dL
KETONES UR: NEGATIVE mg/dL
NITRITE: NEGATIVE
PH: 6 (ref 5.0–8.0)
Protein, ur: NEGATIVE mg/dL
Specific Gravity, Urine: 1.008 (ref 1.005–1.030)

## 2017-06-29 LAB — BASIC METABOLIC PANEL
Anion gap: 10 (ref 5–15)
BUN: 18 mg/dL (ref 6–20)
CHLORIDE: 102 mmol/L (ref 101–111)
CO2: 24 mmol/L (ref 22–32)
Calcium: 8.4 mg/dL — ABNORMAL LOW (ref 8.9–10.3)
Creatinine, Ser: 1.09 mg/dL — ABNORMAL HIGH (ref 0.44–1.00)
GFR calc Af Amer: 48 mL/min — ABNORMAL LOW (ref >60)
GFR calc non Af Amer: 42 mL/min — ABNORMAL LOW (ref >60)
Glucose, Bld: 145 mg/dL — ABNORMAL HIGH (ref 65–99)
POTASSIUM: 3.7 mmol/L (ref 3.5–5.1)
Sodium: 136 mmol/L (ref 135–145)

## 2017-06-29 LAB — CBC WITH DIFFERENTIAL/PLATELET
Abs Immature Granulocytes: 0 10*3/uL (ref 0.0–0.1)
BASOS ABS: 0.1 10*3/uL (ref 0.0–0.1)
Basophils Relative: 1 %
Eosinophils Absolute: 0.2 10*3/uL (ref 0.0–0.7)
Eosinophils Relative: 2 %
HCT: 34 % — ABNORMAL LOW (ref 36.0–46.0)
HEMOGLOBIN: 11 g/dL — AB (ref 12.0–15.0)
Immature Granulocytes: 0 %
LYMPHS PCT: 20 %
Lymphs Abs: 2 10*3/uL (ref 0.7–4.0)
MCH: 30.1 pg (ref 26.0–34.0)
MCHC: 32.4 g/dL (ref 30.0–36.0)
MCV: 92.9 fL (ref 78.0–100.0)
Monocytes Absolute: 1.4 10*3/uL — ABNORMAL HIGH (ref 0.1–1.0)
Monocytes Relative: 14 %
NEUTROS ABS: 6.2 10*3/uL (ref 1.7–7.7)
NEUTROS PCT: 63 %
Platelets: 202 10*3/uL (ref 150–400)
RBC: 3.66 MIL/uL — AB (ref 3.87–5.11)
RDW: 13.7 % (ref 11.5–15.5)
WBC: 10 10*3/uL (ref 4.0–10.5)

## 2017-06-29 LAB — I-STAT CG4 LACTIC ACID, ED: Lactic Acid, Venous: 1.21 mmol/L (ref 0.5–1.9)

## 2017-06-29 LAB — MAGNESIUM: Magnesium: 1.8 mg/dL (ref 1.7–2.4)

## 2017-06-29 LAB — TSH: TSH: 1.739 u[IU]/mL (ref 0.350–4.500)

## 2017-06-29 LAB — MRSA PCR SCREENING: MRSA by PCR: NEGATIVE

## 2017-06-29 LAB — I-STAT TROPONIN, ED: Troponin i, poc: 0.02 ng/mL (ref 0.00–0.08)

## 2017-06-29 LAB — BRAIN NATRIURETIC PEPTIDE: B Natriuretic Peptide: 285.7 pg/mL — ABNORMAL HIGH (ref 0.0–100.0)

## 2017-06-29 LAB — PHOSPHORUS: Phosphorus: 2.6 mg/dL (ref 2.5–4.6)

## 2017-06-29 MED ORDER — AMLODIPINE BESYLATE 2.5 MG PO TABS: 2.5000 mg | ORAL_TABLET | Freq: Every day | ORAL | Status: DC

## 2017-06-29 MED ORDER — LOSARTAN POTASSIUM 50 MG PO TABS
100.0000 mg | ORAL_TABLET | Freq: Every day | ORAL | Status: DC
  Administered 2017-06-29 – 2017-06-30 (×2): 100 mg via ORAL
  Filled 2017-06-29 (×2): qty 2

## 2017-06-29 MED ORDER — LORATADINE 10 MG PO TABS
10.0000 mg | ORAL_TABLET | Freq: Every day | ORAL | Status: DC
  Administered 2017-06-29 – 2017-06-30 (×2): 10 mg via ORAL
  Filled 2017-06-29 (×2): qty 1

## 2017-06-29 MED ORDER — VANCOMYCIN HCL IN DEXTROSE 750-5 MG/150ML-% IV SOLN: 750.0000 mg | INTRAVENOUS | Status: DC

## 2017-06-29 MED ORDER — HYDRALAZINE HCL 20 MG/ML IJ SOLN
10.0000 mg | INTRAMUSCULAR | Status: AC
  Administered 2017-06-29: 10 mg via INTRAVENOUS
  Filled 2017-06-29: qty 1

## 2017-06-29 MED ORDER — ENOXAPARIN SODIUM 30 MG/0.3ML ~~LOC~~ SOLN
30.0000 mg | SUBCUTANEOUS | Status: DC
  Administered 2017-06-29 – 2017-06-30 (×2): 30 mg via SUBCUTANEOUS
  Filled 2017-06-29 (×2): qty 0.3

## 2017-06-29 MED ORDER — PIPERACILLIN-TAZOBACTAM 3.375 G IVPB 30 MIN
3.3750 g | Freq: Once | INTRAVENOUS | Status: AC
  Administered 2017-06-29: 3.375 g via INTRAVENOUS
  Filled 2017-06-29: qty 50

## 2017-06-29 MED ORDER — ASPIRIN EC 81 MG PO TBEC
81.0000 mg | DELAYED_RELEASE_TABLET | Freq: Every day | ORAL | Status: DC
  Administered 2017-06-29 – 2017-06-30 (×2): 81 mg via ORAL
  Filled 2017-06-29 (×2): qty 1

## 2017-06-29 MED ORDER — VANCOMYCIN HCL IN DEXTROSE 1-5 GM/200ML-% IV SOLN
1000.0000 mg | Freq: Once | INTRAVENOUS | Status: AC
  Administered 2017-06-29: 1000 mg via INTRAVENOUS
  Filled 2017-06-29: qty 200

## 2017-06-29 MED ORDER — ONDANSETRON HCL 4 MG PO TABS: 4.0000 mg | ORAL_TABLET | Freq: Four times a day (QID) | ORAL | Status: DC | PRN

## 2017-06-29 MED ORDER — VERAPAMIL HCL ER 240 MG PO TBCR
240.0000 mg | EXTENDED_RELEASE_TABLET | Freq: Two times a day (BID) | ORAL | Status: DC
  Administered 2017-06-29 – 2017-07-01 (×5): 240 mg via ORAL
  Filled 2017-06-29 (×5): qty 1

## 2017-06-29 MED ORDER — ACETAMINOPHEN 325 MG PO TABS
650.0000 mg | ORAL_TABLET | Freq: Four times a day (QID) | ORAL | Status: DC | PRN
  Administered 2017-06-29: 650 mg via ORAL
  Filled 2017-06-29: qty 2

## 2017-06-29 MED ORDER — SODIUM CHLORIDE 0.9 % IV SOLN
INTRAVENOUS | Status: DC
  Administered 2017-06-29: 06:00:00 via INTRAVENOUS

## 2017-06-29 MED ORDER — ESCITALOPRAM OXALATE 20 MG PO TABS
20.0000 mg | ORAL_TABLET | Freq: Every day | ORAL | Status: DC
  Administered 2017-06-29 – 2017-07-01 (×3): 20 mg via ORAL
  Filled 2017-06-29 (×3): qty 1

## 2017-06-29 MED ORDER — BISACODYL 5 MG PO TBEC: 5.0000 mg | DELAYED_RELEASE_TABLET | Freq: Every day | ORAL | Status: DC | PRN

## 2017-06-29 MED ORDER — DEXTROSE 5 % IV SOLN
0.5000 g | Freq: Once | INTRAVENOUS | Status: AC
  Administered 2017-06-29: 0.5 g via INTRAVENOUS
  Filled 2017-06-29: qty 1

## 2017-06-29 MED ORDER — SENNOSIDES-DOCUSATE SODIUM 8.6-50 MG PO TABS: 1.0000 | ORAL_TABLET | Freq: Every evening | ORAL | Status: DC | PRN

## 2017-06-29 MED ORDER — SODIUM CHLORIDE 0.9 % IV SOLN
1.0000 g | INTRAVENOUS | Status: DC
  Administered 2017-06-29 – 2017-07-01 (×3): 1 g via INTRAVENOUS
  Filled 2017-06-29 (×3): qty 10

## 2017-06-29 MED ORDER — DEXTROSE-NACL 5-0.9 % IV SOLN
INTRAVENOUS | Status: DC
Start: 2017-06-29 — End: 2017-07-01
  Administered 2017-06-29 – 2017-06-30 (×2): via INTRAVENOUS

## 2017-06-29 MED ORDER — MAGNESIUM CITRATE PO SOLN: 1.0000 | Freq: Once | ORAL | Status: DC | PRN

## 2017-06-29 MED ORDER — PIPERACILLIN-TAZOBACTAM 3.375 G IVPB
3.3750 g | Freq: Three times a day (TID) | INTRAVENOUS | Status: DC
  Administered 2017-06-29: 3.375 g via INTRAVENOUS
  Filled 2017-06-29: qty 50

## 2017-06-29 MED ORDER — FERROUS SULFATE 325 (65 FE) MG PO TABS
325.0000 mg | ORAL_TABLET | Freq: Every day | ORAL | Status: DC
  Administered 2017-06-29 – 2017-06-30 (×2): 325 mg via ORAL
  Filled 2017-06-29 (×2): qty 1

## 2017-06-29 MED ORDER — VITAMIN D3 25 MCG (1000 UNIT) PO TABS
2000.0000 [IU] | ORAL_TABLET | Freq: Every day | ORAL | Status: DC
  Administered 2017-06-29 – 2017-06-30 (×2): 2000 [IU] via ORAL
  Filled 2017-06-29 (×5): qty 2

## 2017-06-29 MED ORDER — POTASSIUM CHLORIDE CRYS ER 20 MEQ PO TBCR
40.0000 meq | EXTENDED_RELEASE_TABLET | Freq: Once | ORAL | Status: AC
  Administered 2017-06-29: 40 meq via ORAL
  Filled 2017-06-29: qty 2

## 2017-06-29 MED ORDER — ONDANSETRON HCL 4 MG/2ML IJ SOLN: 4.0000 mg | Freq: Four times a day (QID) | INTRAMUSCULAR | Status: DC | PRN

## 2017-06-29 MED ORDER — MIRABEGRON ER 25 MG PO TB24
25.0000 mg | ORAL_TABLET | Freq: Every day | ORAL | Status: DC
  Administered 2017-06-29 – 2017-06-30 (×2): 25 mg via ORAL
  Filled 2017-06-29 (×3): qty 1

## 2017-06-29 MED ORDER — ERYTHROMYCIN 5 MG/GM OP OINT
1.0000 "application " | TOPICAL_OINTMENT | Freq: Four times a day (QID) | OPHTHALMIC | Status: DC
  Administered 2017-06-29 – 2017-07-01 (×12): 1 via OPHTHALMIC
  Filled 2017-06-29 (×2): qty 3.5

## 2017-06-29 MED ORDER — ACETAMINOPHEN 650 MG RE SUPP: 650.0000 mg | Freq: Four times a day (QID) | RECTAL | Status: DC | PRN

## 2017-06-29 NOTE — H&P (Addendum)
History and Physical   [facility] @ [place] Admission History and Physical [employer], D.O.    Patient Name: Julie Oliver MR#: [id number] Date of Birth: [birth year] Date of Admission: 06/28/2017  Primary Care Physician: Lajean Manes, MD  Chief Complaint:  Chief Complaint  Patient presents with  . Altered Mental Status  Please note the entire history is obtained from the patient's emergency department chart, emergency department provider and the patient's family who is at the bedside. Patient's personal history is limited by altered mental status.   HPI: Julie Oliver is a 84 y.o. female with a known history of HTN, insomnia, SBO, GERD presents to the emergency department for evaluation of AMS.  Patient was in a usual state of health until yesterday when she became progressively more confused and lethargic.  Daughter gave her Halcion and ativan which made her more agitated and confused.  At baseline she is A&Ox3, mostly independent with ADLs.  She has had decreased appetite  But has been drinking fluids.  She did have foul smelling urine and incontinence which is unusual for her.    Of note, patient had a stroke in 12/18 which did not leave her with any residual deficits.  Otherwise there has been no change in status. Patient has been taking medication as prescribed and there has been no recent change in medication or diet.  No recent antibiotics.  There has been no recent illness, hospitalizations, travel or sick contacts.    EMS/ED Course: Patient received Zosyn, [place] admission has been requested for further management of AMS, likely 2/2 UTI.  Review of Systems:  Unable to obtain 2/2 AMS  Past Medical History:  Diagnosis Date  . Gastro - esophageal reflux disease   . Hypertension   . Insomnia   . Small bowel obstruction [facility])     Past Surgical History:  Procedure Laterality Date  . MOUTH SURGERY       reports that she has never  smoked. She has never used smokeless tobacco. She reports that she does not drink alcohol or use drugs.  No Known Allergies  No family history on file.  Prior to Admission medications   Medication Sig Start Date End Date Taking? Authorizing Provider  aspirin EC 81 MG tablet Take 81 mg by mouth daily.   Yes [provider]  Cholecalciferol (VITAMIN D) 2000 units tablet Take 2,000 Units by mouth daily.   Yes [provider]  escitalopram (LEXAPRO) 20 MG tablet Take 20 mg by mouth daily.   Yes [provider]  ferrous sulfate 325 (65 FE) MG tablet Take 325 mg by mouth daily with breakfast.   Yes [provider]  loratadine (CLARITIN) 10 MG tablet Take 10 mg by mouth daily.   Yes [provider]  LORazepam (ATIVAN) 0.5 MG tablet Take 0.5 mg by mouth 2 (two) times daily as needed for anxiety.   Yes [provider]  losartan (COZAAR) 50 MG tablet Take 100 mg by mouth daily.    Yes [provider]  mirabegron ER (MYRBETRIQ) 25 MG TB24 tablet Take 25 mg by mouth at bedtime.   Yes [provider]  Multiple Vitamins-Minerals (PRESERVISION AREDS 2 PO) Take 1 tablet by mouth daily.   Yes [provider]  triazolam (HALCION) 0.25 MG tablet Take 0.25 mg by mouth at bedtime as needed for sleep.   Yes [provider]  verapamil (CALAN-SR) 240 MG CR tablet Take 240 mg by mouth  2 (two) times daily.    Yes [provider]  amLODipine (NORVASC) 2.5 MG tablet Take 1 tablet (2.5 mg total) by mouth daily. 08/13/11 08/12/12  Calvert Cantor, MD  mirtazapine (REMERON) 7.5 MG tablet Take 1 tablet (7.5 mg total) by mouth at bedtime. Patient not taking: Reported on 02/12/2014 08/13/11 09/12/11  Calvert Cantor, MD  omeprazole (PRILOSEC) 20 MG capsule Take 2 capsules (40 mg total) by mouth daily. Patient not taking: Reported on 06/29/2017 02/12/14   Charlesetta Shanks, MD    Physical Exam: Vitals:   06/29/17 0200 06/29/17 0215  06/29/17 0230 06/29/17 0245  BP: (!) 195/99 (!) 211/124 (!) 222/97   Pulse: 74 87  87  Resp: 20 (!) 22  (!) 22  Temp:      TempSrc:      SpO2: 98% 100%  97%  Weight:      Height:        GENERAL: 84 y.o.-year-old female patient,  lying in the bed in no acute distress.  Awake, alert, follows commands but agitated, not speaking or answering questions.  HEENT: Right eye with injected scleraHead atraumatic, normocephalic. Pupils equal. Mucus membranes very dry.  NECK: Supple No JVD CHEST: Normal breath sounds bilaterally. No wheezing, rales, rhonchi or crackles. No use of accessory muscles of respiration.  No reproducible chest wall tenderness.  CARDIOVASCULAR: S1, S2 normal. No murmurs, rubs, or gallops. Cap refill <2 seconds. Pulses intact distally.  ABDOMEN: Soft, nondistended, nontender. No rebound, guarding, rigidity. Normoactive bowel sounds present in all four quadrants.  EXTREMITIES: No pedal edema, cyanosis, or clubbing. No calf tenderness or Homan's sign.  NEUROLOGIC: The patient is awake and alert, responsive, follows commands but is not communicative. . Cranial nerves II through XII are grossly intact with no focal sensorimotor deficit. PSYCHIATRIC:  Normal affect, mood, thought content. SKIN: Warm, dry, and intact without obvious rash, lesion, or ulcer.    Labs on Admission:  CBC: Recent Labs  Lab 06/29/17 0104  WBC 10.0  NEUTROABS 6.2  HGB 11.0*  HCT 34.0*  MCV 92.9  PLT 462   Basic Metabolic Panel: Recent Labs  Lab 06/29/17 0104  NA 140  K 4.2  CL 106  CO2 26  GLUCOSE 118*  BUN 21*  CREATININE 1.34*  CALCIUM 8.1*   GFR: Estimated Creatinine Clearance: 22.5 mL/min (A) (by C-G formula based on SCr of 1.34 mg/dL (H)). Liver Function Tests: Recent Labs  Lab 06/29/17 0104  AST 25  ALT 19  ALKPHOS 91  BILITOT 0.5  PROT 6.0*  ALBUMIN 3.0*   No results for input(s): LIPASE, AMYLASE in the last 168 hours. No results for input(s): AMMONIA in the last 168  hours. Coagulation Profile: No results for input(s): INR, PROTIME in the last 168 hours. Cardiac Enzymes: No results for input(s): CKTOTAL, CKMB, CKMBINDEX, TROPONINI in the last 168 hours. BNP (last 3 results) No results for input(s): PROBNP in the last 8760 hours. HbA1C: No results for input(s): HGBA1C in the last 72 hours. CBG: No results for input(s): GLUCAP in the last 168 hours. Lipid Profile: No results for input(s): CHOL, HDL, LDLCALC, TRIG, CHOLHDL, LDLDIRECT in the last 72 hours. Thyroid Function Tests: No results for input(s): TSH, T4TOTAL, FREET4, T3FREE, THYROIDAB in the last 72 hours. Anemia Panel: No results for input(s): VITAMINB12, FOLATE, FERRITIN, TIBC, IRON, RETICCTPCT in the last 72 hours. Urine analysis:    Component Value Date/Time   COLORURINE STRAW (A) 06/28/2017 [place] 06/28/2017 2355  LABSPEC 1.008 06/28/2017 2355   PHURINE 6.0 06/28/2017 2355   GLUCOSEU NEGATIVE 06/28/2017 2355   HGBUR SMALL (A) 06/28/2017 2355   BILIRUBINUR NEGATIVE 06/28/2017 2355   BILIRUBINUR neg 08/18/2011 1750   KETONESUR NEGATIVE 06/28/2017 2355   PROTEINUR NEGATIVE 06/28/2017 2355   UROBILINOGEN 0.2 02/12/2014 1622   NITRITE NEGATIVE 06/28/2017 2355   LEUKOCYTESUR SMALL (A) 06/28/2017 2355   Sepsis Labs: @LABRCNTIP (procalcitonin:4,lacticidven:4) )No results found for this or any previous visit (from the past 240 hour(s)).   Radiological Exams on Admission: Ct Head Wo Contrast  Result Date: 06/29/2017 CLINICAL DATA:  Acute onset altered mental status. Generalized weakness and lethargy. EXAM: CT HEAD WITHOUT CONTRAST TECHNIQUE: Contiguous axial images were obtained from the base of the skull through the vertex without intravenous contrast. COMPARISON:  CT of the head performed 08/22/2011, and MRI of the brain performed 03/15/2017 FINDINGS: Brain: No evidence of acute infarction, hemorrhage, hydrocephalus, extra-axial collection or mass lesion / mass  effect. Prominence of the ventricles and sulci reflects mild to moderate cortical volume loss. Chronic lacunar infarcts are noted at the basal ganglia bilaterally. The brainstem and fourth ventricle are within normal limits. The cerebral hemispheres demonstrate grossly normal gray-white differentiation. No mass effect or midline shift is seen. Vascular: No hyperdense vessel or unexpected calcification. Skull: There is no evidence of fracture; visualized osseous structures are unremarkable in appearance. Sinuses/Orbits: The visualized portions of the orbits are within normal limits. Mucosal thickening is noted at the maxillary sinuses bilaterally. The remaining paranasal sinuses and mastoid air cells are well-aerated. Other: No significant soft tissue abnormalities are seen. IMPRESSION: 1. No acute intracranial pathology seen on CT. 2. Mild to moderate cortical volume loss. 3. Chronic lacunar infarcts at the basal ganglia bilaterally. 4. Mucosal thickening at the maxillary sinuses bilaterally. Electronically Signed   By: Garald Balding M.D.   On: 06/29/2017 01:52   Dg Chest Port 1 View  Result Date: 06/29/2017 CLINICAL DATA:  Acute onset of altered mental status. EXAM: PORTABLE CHEST 1 VIEW COMPARISON:  Chest radiograph performed 02/27/2017 FINDINGS: The lungs are well-aerated. Mild vascular congestion is noted. Peripheral atelectasis is noted bilaterally. There is no evidence of pleural effusion or pneumothorax. The cardiomediastinal silhouette is within normal limits. No acute osseous abnormalities are seen. IMPRESSION: Mild vascular congestion noted. Peripheral atelectasis seen bilaterally. Electronically Signed   By: Garald Balding M.D.   On: 06/29/2017 00:57    Assessment/Plan  This is a 84 y.o. female with a history of HTN, insomnia, SBO, GERD now being admitted with:  #. Altered mental status likely 2/2 UTI, dehydration - Admit inpatient - IV Rocephin - IV fluid hydration - Follow up blood and  urine cultures - Neurochecks - Hold sedative medications  #. Acute kidney injury  - IV fluids and repeat BMP in AM.  - Avoid nephrotoxic medications - Bladder scan and place foley catheter if evidence of urinary retention   #. History of HTN - Continue Cozaar, verapamil  #. History of GERd - Continue Prilosec  #. History of CVA - Continue aspirin  #. History of depression - Continue Lexapro  Admission status: Inpatient IV Fluids: NS Diet/Nutrition: NPO Consults called: None  DVT Px: Lovenox, SCDs and early ambulation. Code Status: DNR  Disposition Plan: To home in 2-3 days  All the records are reviewed and case discussed with ED provider. Management plans discussed with the patient and/or family who express understanding and agree with plan of care.  Rayah Fines D.O. on 06/29/2017 at  3:17 AM CC: Primary care physician; Lajean Manes, MD   06/29/2017, 3:17 AM

## 2017-06-29 NOTE — Progress Notes (Signed)
Pharmacy Antibiotic Note  Julie Oliver is a 84 y.o. female admitted on 06/28/2017 with sepsis.  Pharmacy has been consulted for vancomycin and zosyn dosing.Initial doses ordered in the ED  Plan: Continue zosyn 3.375 gm IV q8 hours Continue vancomycin 750 mg IV q48 hours F/u renal function, cultures and clinical course  Height: 5\' 7"  (170.2 cm) Weight: 125 lb (56.7 kg) IBW/kg (Calculated) : 61.6  Temp (24hrs), Avg:100.4 F (38 C), Min:100.4 F (38 C), Max:100.4 F (38 C)  Recent Labs  Lab 06/29/17 0048  LATICACIDVEN 1.21    CrCl cannot be calculated (Patient's most recent lab result is older than the maximum 21 days allowed.).    No Known Allergies   Thank you for allowing pharmacy to be a part of this patient's care.  Excell Seltzer [place] 06/29/2017 1:03 AM

## 2017-06-29 NOTE — ED Provider Notes (Signed)
[place] EMERGENCY DEPARTMENT Provider Note   CSN: [id number] Arrival date & time: 06/28/17  2341     History   Chief Complaint Chief Complaint  Patient presents with  . Altered Mental Status    HPI Julie Oliver is a 84 y.o. female with a hx of GERD, retention, insomnia, small bowel obstruction presents to the Emergency Department via EMS with complaints of altered mental status.  5 caveat for altered mental status.  History initially provided by EMS.  Paramedics states that he lives alone in an independent living facility.  Family checked on her yesterday and she seemed more tired than usual but was at baseline mental status.  Patient did eat yesterday and seemed to feel better afterwards.  Family reported to EMS the patient is able to walk and perform most of her ADLs without assistance.    Family is at bedside.  Reports that she is normally alert, oriented and conversive.  Family reports that this morning when they checked on her she was weak, confused and hallucinating.  They report history of recurrent urinary tract infection.  Patient was treated with antibiotic approximately 1 month ago.  Daughter reports that yesterday patient's apartment smelled of urine and she was incontinent of urine which is unusual for her.  Daughter reports she had a small stroke in December but was without deficits.  The history is provided by medical records, the EMS personnel and a relative. No language interpreter was used.    Past Medical History:  Diagnosis Date  . Gastro - esophageal reflux disease   . Hypertension   . Insomnia   . Small bowel obstruction [facility])     Patient Active Problem List   Diagnosis Date Noted  . Altered mental status 06/29/2017    Past Surgical History:  Procedure Laterality Date  . MOUTH SURGERY       OB History   None      Home Medications    Prior to Admission medications   Medication Sig Start Date End Date Taking?  Authorizing Provider  aspirin EC 81 MG tablet Take 81 mg by mouth daily.   Yes [provider]  Cholecalciferol (VITAMIN D) 2000 units tablet Take 2,000 Units by mouth daily.   Yes [provider]  escitalopram (LEXAPRO) 20 MG tablet Take 20 mg by mouth daily.   Yes [provider]  ferrous sulfate 325 (65 FE) MG tablet Take 325 mg by mouth daily with breakfast.   Yes [provider]  loratadine (CLARITIN) 10 MG tablet Take 10 mg by mouth daily.   Yes [provider]  LORazepam (ATIVAN) 0.5 MG tablet Take 0.5 mg by mouth 2 (two) times daily as needed for anxiety.   Yes [provider]  losartan (COZAAR) 50 MG tablet Take 100 mg by mouth daily.    Yes [provider]  mirabegron ER (MYRBETRIQ) 25 MG TB24 tablet Take 25 mg by mouth at bedtime.   Yes [provider]  Multiple Vitamins-Minerals (PRESERVISION AREDS 2 PO) Take 1 tablet by mouth daily.   Yes [provider]  triazolam (HALCION) 0.25 MG tablet Take 0.25 mg by mouth at bedtime as needed for sleep.   Yes [provider]  verapamil (CALAN-SR) 240 MG CR tablet Take 240 mg by mouth 2 (two) times daily.    Yes [provider]  amLODipine (NORVASC) 2.5 MG tablet Take 1 tablet (2.5 mg total) by mouth daily. 08/13/11 08/12/12  Calvert Cantor,  MD  mirtazapine (REMERON) 7.5 MG tablet Take 1 tablet (7.5 mg total) by mouth at bedtime. Patient not taking: Reported on 02/12/2014 08/13/11 09/12/11  Calvert Cantor, MD  omeprazole (PRILOSEC) 20 MG capsule Take 2 capsules (40 mg total) by mouth daily. Patient not taking: Reported on 06/29/2017 02/12/14   Charlesetta Shanks, MD    Family History No family history on file.  Social History Social History   Tobacco Use  . Smoking status: Never Smoker  . Smokeless tobacco: Never Used  Substance Use Topics  . Alcohol use: No  . Drug use: No     Allergies   Patient has no known allergies.   Review of  Systems Review of Systems  Unable to perform ROS: Mental status change     Physical Exam Updated Vital Signs BP (!) 200/65   Temp (!) 100.4 F (38 C) (Rectal)   Resp (!) 25   Ht 5\' 7"  (1.702 m)   Wt 56.7 kg (125 lb)   BMI 19.58 kg/m   Physical Exam  Constitutional: She appears well-developed and well-nourished. No distress.  HENT:  Head: Normocephalic and atraumatic.  Eyes: EOM are normal. Right eye exhibits discharge (purulent). Right conjunctiva is injected. No scleral icterus. Right pupil is not round. Left pupil is not round.  Erythema of the right eyelid without edema or palpable induration  Neck: Normal range of motion. No JVD present.  Cardiovascular: Normal rate, normal heart sounds and intact distal pulses.  Pulses:      Radial pulses are 2+ on the right side, and 2+ on the left side.       Dorsalis pedis pulses are 2+ on the right side, and 2+ on the left side.  Pulmonary/Chest: Effort normal and breath sounds normal.  Abdominal: Soft. Bowel sounds are normal. There is no tenderness. There is no rigidity, no rebound and no guarding.    Well-healed midline incision  Musculoskeletal: She exhibits no edema.  Neurological: She is alert.  Alert to person only.  She does follow commands.  Moves extremities without ataxia.  Skin: Skin is warm and dry. Capillary refill takes less than 2 seconds.  Nursing note and vitals reviewed.    ED Treatments / Results  Labs (all labs ordered are listed, but only abnormal results are displayed) Labs Reviewed  COMPREHENSIVE METABOLIC PANEL - Abnormal; Notable for the following components:      Result Value   Glucose, Bld 118 (*)    BUN 21 (*)    Creatinine, Ser 1.34 (*)    Calcium 8.1 (*)    Total Protein 6.0 (*)    Albumin 3.0 (*)    GFR calc non Af Amer 33 (*)    GFR calc Af Amer 38 (*)    All other components within normal limits  CBC WITH DIFFERENTIAL/PLATELET - Abnormal; Notable for the following components:   RBC  3.66 (*)    Hemoglobin 11.0 (*)    HCT 34.0 (*)    Monocytes Absolute 1.4 (*)    All other components within normal limits  URINALYSIS, ROUTINE W REFLEX MICROSCOPIC - Abnormal; Notable for the following components:   Color, Urine STRAW (*)    Hgb urine dipstick SMALL (*)    Leukocytes, UA SMALL (*)    All other components within normal limits  CBC - Abnormal; Notable for the following components:   WBC 12.9 (*)    Hemoglobin 11.9 (*)    All other components within normal limits  BASIC  METABOLIC PANEL - Abnormal; Notable for the following components:   Glucose, Bld 145 (*)    Creatinine, Ser 1.09 (*)    Calcium 8.4 (*)    GFR calc non Af Amer 42 (*)    GFR calc Af Amer 48 (*)    All other components within normal limits  CULTURE, BLOOD (ROUTINE X 2)  CULTURE, BLOOD (ROUTINE X 2)  URINE CULTURE  URINE CULTURE  MRSA PCR SCREENING  MAGNESIUM  PHOSPHORUS  TSH  I-STAT CG4 LACTIC ACID, ED  I-STAT TROPONIN, ED    EKG EKG Interpretation  Date/Time:  Sunday Jun 29 2017 00:16:59 EDT Ventricular Rate:  77 PR Interval:    QRS Duration: 106 QT Interval:  426 QTC Calculation: 483 R Axis:   97 Text Interpretation:  Right and left arm electrode reversal, interpretation assumes no reversal Sinus or ectopic atrial rhythm Right axis deviation Abnormal lateral Q waves Borderline ST depression, lateral leads Confirmed by Orpah Greek [phone]) on 06/29/2017 3:23:50 AM     Radiology Ct Head Wo Contrast  Result Date: 06/29/2017 CLINICAL DATA:  Acute onset altered mental status. Generalized weakness and lethargy. EXAM: CT HEAD WITHOUT CONTRAST TECHNIQUE: Contiguous axial images were obtained from the base of the skull through the vertex without intravenous contrast. COMPARISON:  CT of the head performed 08/22/2011, and MRI of the brain performed 03/15/2017 FINDINGS: Brain: No evidence of acute infarction, hemorrhage, hydrocephalus, extra-axial collection or mass lesion / mass effect.  Prominence of the ventricles and sulci reflects mild to moderate cortical volume loss. Chronic lacunar infarcts are noted at the basal ganglia bilaterally. The brainstem and fourth ventricle are within normal limits. The cerebral hemispheres demonstrate grossly normal gray-white differentiation. No mass effect or midline shift is seen. Vascular: No hyperdense vessel or unexpected calcification. Skull: There is no evidence of fracture; visualized osseous structures are unremarkable in appearance. Sinuses/Orbits: The visualized portions of the orbits are within normal limits. Mucosal thickening is noted at the maxillary sinuses bilaterally. The remaining paranasal sinuses and mastoid air cells are well-aerated. Other: No significant soft tissue abnormalities are seen. IMPRESSION: 1. No acute intracranial pathology seen on CT. 2. Mild to moderate cortical volume loss. 3. Chronic lacunar infarcts at the basal ganglia bilaterally. 4. Mucosal thickening at the maxillary sinuses bilaterally. Electronically Signed   By: Garald Balding M.D.   On: 06/29/2017 01:52   Dg Chest Port 1 View  Result Date: 06/29/2017 CLINICAL DATA:  Acute onset of altered mental status. EXAM: PORTABLE CHEST 1 VIEW COMPARISON:  Chest radiograph performed 02/27/2017 FINDINGS: The lungs are well-aerated. Mild vascular congestion is noted. Peripheral atelectasis is noted bilaterally. There is no evidence of pleural effusion or pneumothorax. The cardiomediastinal silhouette is within normal limits. No acute osseous abnormalities are seen. IMPRESSION: Mild vascular congestion noted. Peripheral atelectasis seen bilaterally. Electronically Signed   By: Garald Balding M.D.   On: 06/29/2017 00:57    Procedures Procedures (including critical care time)  Medications Ordered in ED Medications  0.9 %  sodium chloride infusion (1,000 mLs Intravenous New Bag/Given 06/29/17 0022)  erythromycin ophthalmic ointment 1 application (1 application Right Eye  Given 06/29/17 0250)  piperacillin-tazobactam (ZOSYN) IVPB 3.375 g (has no administration in time range)  vancomycin (VANCOCIN) IVPB 750 mg/150 ml premix (has no administration in time range)  aspirin EC tablet 81 mg (has no administration in time range)  cholecalciferol (VITAMIN D) tablet 2,000 Units (has no administration in time range)  escitalopram (LEXAPRO) tablet 20 mg (has  no administration in time range)  ferrous sulfate tablet 325 mg (has no administration in time range)  loratadine (CLARITIN) tablet 10 mg (has no administration in time range)  losartan (COZAAR) tablet 100 mg (has no administration in time range)  mirabegron ER (MYRBETRIQ) tablet 25 mg (has no administration in time range)  verapamil (CALAN-SR) CR tablet 240 mg (has no administration in time range)  enoxaparin (LOVENOX) injection 30 mg (has no administration in time range)  0.9 %  sodium chloride infusion ( Intravenous New Bag/Given 06/29/17 0555)  acetaminophen (TYLENOL) tablet 650 mg (has no administration in time range)    Or  acetaminophen (TYLENOL) suppository 650 mg (has no administration in time range)  senna-docusate (Senokot-S) tablet 1 tablet (has no administration in time range)  bisacodyl (DULCOLAX) EC tablet 5 mg (has no administration in time range)  magnesium citrate solution 1 Bottle (has no administration in time range)  ondansetron (ZOFRAN) tablet 4 mg (has no administration in time range)    Or  ondansetron (ZOFRAN) injection 4 mg (has no administration in time range)  cefTRIAXone (ROCEPHIN) 1 g in sodium chloride 0.9 % 100 mL IVPB (1 g Intravenous New Bag/Given 06/29/17 0556)  piperacillin-tazobactam (ZOSYN) IVPB 3.375 g (0 g Intravenous Stopped 06/29/17 0203)  vancomycin (VANCOCIN) IVPB 1000 mg/200 mL premix (0 mg Intravenous Stopped 06/29/17 0238)  hydrALAZINE (APRESOLINE) injection 10 mg (10 mg Intravenous Given 06/29/17 0323)     Initial Impression / Assessment and Plan / ED Course  I have  reviewed the triage vital signs and the nursing notes.  Pertinent labs & imaging results that were available during my care of the patient were reviewed by me and considered in my medical decision making (see chart for details).  Clinical Course as of Jun 29 712  Sun Jun 29, 2017  0158 Negative for nitrites, protein and ketones.  Small leukocytes and 21-50 white blood cells without bacteria noted.  Urine culture sent however this may be a urinary tract infection.    Urinalysis, Routine w reflex microscopic(!) [HM]  0159 Elevated from baseline.  Creatinine(!): 1.34 [HM]  0159 Within normal limits  Lactic Acid, Venous: 1.21 [HM]  0159 Low-grade fever  Temp(!): 100.4 F (38 C) [HM]  0159 Tachypnea  Resp(!): 25 [HM]  0159 No acute abnormalities.  I personally evaluated these images.  CT Head Wo Contrast [HM]  0200 No evidence of pneumonia or pneumothorax.  I personally evaluated these images.  DG Chest Port 1 View [HM]  [phone] Discussed with Dr. Ara Kussmaul who will admit   [HM]    Clinical Course User Index [HM] Elise Gladden, Gwenlyn Perking    Presents with altered mental status.  Elevation in serum creatinine.  Urinalysis with white blood cells but no significant bacteria.  Suspect this is likely the source of patient's section however this does not look significantly infected.  Patient with low-grade fever and tachypnea.  No tachycardia or hypotension.  In fact patient is hypertensive.  She is altered enough that I am unable to complete a full neurologic exam however she does follow some commands.  Patient is very sleepy on my exam however family reports that they did give her sleeping medicine prior to coming to the hospital.  Suspected sepsis however patient without hypotension or elevated lactic acid.  She was given vancomycin and Zosyn.  Continuous fluid infusion was ordered.  30 mL/kg bolus not ordered due to lack of elevated lactic acid or hypotension.  Erythromycin for right  conjunctivitis was  ordered.  The patient was discussed with and seen by Dr. Betsey Holiday who agrees with the treatment plan.   Final Clinical Impressions(s) / ED Diagnoses   Final diagnoses:  Altered mental status, unspecified altered mental status type  AKI (acute kidney injury) [facility])  Lower urinary tract infectious disease    ED Discharge Orders    None       Jackquline Branca, Gwenlyn Perking 06/29/17 3244    Orpah Greek, MD 06/29/17 [phone]

## 2017-06-29 NOTE — ED Notes (Signed)
Patient transported to CT 

## 2017-06-29 NOTE — Progress Notes (Addendum)
Pt axillary temperature 101, given 650mg  Tylenol and placed cold rag on forehead. CCMD notified that pt has 9 beat Vtach while RN in the room. Pt in bed resting, notified MD. Ordered Potassium PO and 1 Magnesium IVPD. Will implement orders and continue to monitor pt.

## 2017-06-29 NOTE — ED Notes (Signed)
Attempted to call report x 1  

## 2017-06-29 NOTE — Progress Notes (Signed)
NURSING PROGRESS NOTE  Julie Oliver [id number] Admission Data: 06/29/2017 6:36 AM Attending Provider: Bonnell Public, MD TMH:[id number], Hal, MD Code Status: DNR   Julie Oliver is a 84 y.o. female patient admitted from ED:  -No acute distress noted.  -No complaints of shortness of breath.  -No complaints of chest pain.   Cardiac Monitoring: Box # 10 in place. Cardiac monitor yields:normal sinus rhythm.  Blood pressure 112/86, pulse 90, temperature 98.5 F (36.9 C), temperature source Oral, resp. rate 18, height 5\' 7"  (1.702 m), weight 56.7 kg (125 lb), SpO2 100 %.   IV Fluids:  IV in place, occlusive dsg intact without redness, IV cath antecubital left, condition patent and no redness normal saline.   Allergies:  Patient has no known allergies.  Past Medical History:   has a past medical history of Gastro - esophageal reflux disease, Hypertension, Insomnia, and Small bowel obstruction ([place]).  Past Surgical History:   has a past surgical history that includes Mouth surgery.  Social History:   reports that she has never smoked. She has never used smokeless tobacco. She reports that she does not drink alcohol or use drugs.  Skin: intact, dry  Patient/Family orientated to room. Information packet given to patient/family. Admission inpatient armband information verified with patient/family to include name and date of birth and placed on patient arm. Side rails up x 2, fall assessment and education completed with patient/family. Patient/family able to verbalize understanding of risk associated with falls and verbalized understanding to call for assistance before getting out of bed. Call light within reach. Patient/family able to voice and demonstrate understanding of unit orientation instructions.

## 2017-06-29 NOTE — H&P (Signed)
Patient was admitted earlier today.  Julie Oliver is a 84 y.o. female with a known history of HTN, insomnia, SBO, GERD presents to the emergency department for evaluation of AMS.  Patient was in a usual state of health until yesterday when she became progressively more confused and lethargic.  Daughter gave her Halcion and ativan which made her more agitated and confused.  At baseline she is A&Ox3, mostly independent with ADLs.  She has had decreased appetite  But has been drinking fluids.  She did have foul smelling urine and incontinence which is unusual for her.  Work-up is in progress.  Acute kidney injury is resolving.  Leukocytosis of 12.9 is noted.  T-max of 100.4 is noted.  Patient may be having nausea and vomiting as well.  With anion patient's baseline, the patient might have minimal abdominal distention.  Bowel sound is a bit hyperactive.  We will continue to keep patient n.p.o.  We will get an abdominal x-ray.  Will repeat chest x-ray, looking for infiltrates.  Will discontinue vancomycin, considering negative MRSA screening.  Patient remains quite ill.  Prognosis is guarded.  We will continue to manage expectantly.  We will get a.m. labs.  Cautious hydration.

## 2017-06-29 NOTE — Progress Notes (Signed)
PT Cancellation Note  Patient Details Name: Julie Oliver MRN: [id number] DOB: [birth year]   Cancelled Treatment:    Reason Eval/Treat Not Completed: Other (comment).  Pt was lethargic and unable to respond to PT so will try again tomorrow.   Ramond Dial 06/29/2017, 1:45 PM   Mee Hives, PT MS Acute Rehab Dept. Number: [place]

## 2017-06-29 NOTE — Evaluation (Signed)
Clinical/Bedside Swallow Evaluation Patient Details  Name: Julie Oliver MRN: [id number] Date of Birth: 11-05-1919  Today's Date: 06/29/2017 Time: SLP Start Time (ACUTE ONLY): 0911 SLP Stop Time (ACUTE ONLY): 0920 SLP Time Calculation (min) (ACUTE ONLY): 9 min  Past Medical History:  Past Medical History:  Diagnosis Date  . Gastro - esophageal reflux disease   . Hypertension   . Insomnia   . Small bowel obstruction [facility])    Past Surgical History:  Past Surgical History:  Procedure Laterality Date  . MOUTH SURGERY     HPI:  Pt is a 84 y.o. female with a known history of HTN, insomnia, SBO, GERD presents to the emergency department for evaluation of AMS likely secondary to UTI.   Assessment / Plan / Recommendation Clinical Impression  Pt's mentation and mandibular tremor impact her ability to safely and efficiently consume POs at this time. She has poor oral acceptance with various textures and delivery methods trialed. Oral transit is relatively timely with purees, but with ice chips she does not have the sustained attention to complete oral preparation and ultimately she has a weak, delayed cough that is concerning for premature spillage toward the airway. Pt was able to get only minimal amounts of thin liquid into her mouth, with oral phase appearing discoordinated but without overt signs of aspiration following. Recommend keeping her NPO except for meds crushed in puree for today. Will f/u to assess for readiness to advance pending improvements in her mentation.  SLP Visit Diagnosis: Dysphagia, unspecified (R13.10)    Aspiration Risk  Moderate aspiration risk    Diet Recommendation NPO except meds   Medication Administration: Crushed with puree    Other  Recommendations Oral Care Recommendations: Oral care QID Other Recommendations: Have oral suction available   Follow up Recommendations (tba)      Frequency and Duration min 2x/week  2 weeks       Prognosis Prognosis  for Safe Diet Advancement: Good Barriers to Reach Goals: Cognitive deficits      Swallow Study   General HPI: Pt is a 84 y.o. female with a known history of HTN, insomnia, SBO, GERD presents to the emergency department for evaluation of AMS likely secondary to UTI. Type of Study: Bedside Swallow Evaluation Previous Swallow Assessment: none in chart Diet Prior to this Study: NPO Temperature Spikes Noted: Yes(100.4) Respiratory Status: Room air History of Recent Intubation: No Behavior/Cognition: Alert;Doesn't follow directions Oral Cavity Assessment: Other (comment)(unable to visualize well) Oral Care Completed by SLP: No Oral Cavity - Dentition: Other (Comment)(unable to visualize well) Vision: Impaired for self-feeding Self-Feeding Abilities: Total assist Patient Positioning: Upright in bed Baseline Vocal Quality: Normal;Other (comment)(but speech very difficult to understand) Volitional Cough: Cognitively unable to elicit Volitional Swallow: Unable to elicit    Oral/Motor/Sensory Function Overall Oral Motor/Sensory Function: Other (comment)(unable to follow commands for formal assessment)   Ice Chips Ice chips: Impaired Presentation: Spoon Oral Phase Impairments: Poor awareness of bolus Oral Phase Functional Implications: Prolonged oral transit Pharyngeal Phase Impairments: Suspected delayed Swallow;Cough - Delayed   Thin Liquid Thin Liquid: Impaired Presentation: Straw Oral Phase Impairments: Poor awareness of bolus Oral Phase Functional Implications: Prolonged oral transit Pharyngeal  Phase Impairments: Suspected delayed Swallow    Nectar Thick Nectar Thick Liquid: Not tested   Honey Thick Honey Thick Liquid: Not tested   Puree Puree: Impaired Presentation: Spoon Oral Phase Impairments: Poor awareness of bolus   Solid   GO   Solid: Not tested  Germain Osgood 06/29/2017,9:30 AM   Germain Osgood, M.A. CCC-SLP [phone]

## 2017-06-30 ENCOUNTER — Inpatient Hospital Stay (HOSPITAL_COMMUNITY): Payer: Medicare Other

## 2017-06-30 ENCOUNTER — Other Ambulatory Visit (HOSPITAL_COMMUNITY): Payer: Self-pay

## 2017-06-30 DIAGNOSIS — R112 Nausea with vomiting, unspecified: Secondary | ICD-10-CM

## 2017-06-30 LAB — CBC WITH DIFFERENTIAL/PLATELET
Abs Immature Granulocytes: 0.1 10*3/uL (ref 0.0–0.1)
Basophils Absolute: 0 10*3/uL (ref 0.0–0.1)
Basophils Relative: 0 %
Eosinophils Absolute: 0 10*3/uL (ref 0.0–0.7)
Eosinophils Relative: 0 %
HCT: 32.2 % — ABNORMAL LOW (ref 36.0–46.0)
Hemoglobin: 10.5 g/dL — ABNORMAL LOW (ref 12.0–15.0)
Immature Granulocytes: 0 %
Lymphocytes Relative: 15 %
Lymphs Abs: 1.6 10*3/uL (ref 0.7–4.0)
MCH: 29.7 pg (ref 26.0–34.0)
MCHC: 32.6 g/dL (ref 30.0–36.0)
MCV: 91 fL (ref 78.0–100.0)
Monocytes Absolute: 1.3 10*3/uL — ABNORMAL HIGH (ref 0.1–1.0)
Monocytes Relative: 12 %
Neutro Abs: 8.1 10*3/uL — ABNORMAL HIGH (ref 1.7–7.7)
Neutrophils Relative %: 73 %
Platelets: 194 10*3/uL (ref 150–400)
RBC: 3.54 MIL/uL — ABNORMAL LOW (ref 3.87–5.11)
RDW: 13.9 % (ref 11.5–15.5)
WBC: 11.1 10*3/uL — ABNORMAL HIGH (ref 4.0–10.5)

## 2017-06-30 LAB — URINE CULTURE: Culture: NO GROWTH

## 2017-06-30 LAB — RENAL FUNCTION PANEL
Albumin: 2.6 g/dL — ABNORMAL LOW (ref 3.5–5.0)
Anion gap: 9 (ref 5–15)
BUN: 18 mg/dL (ref 6–20)
CO2: 25 mmol/L (ref 22–32)
Calcium: 8.4 mg/dL — ABNORMAL LOW (ref 8.9–10.3)
Chloride: 107 mmol/L (ref 101–111)
Creatinine, Ser: 0.97 mg/dL (ref 0.44–1.00)
GFR calc Af Amer: 56 mL/min — ABNORMAL LOW (ref >60)
GFR calc non Af Amer: 48 mL/min — ABNORMAL LOW (ref >60)
Glucose, Bld: 143 mg/dL — ABNORMAL HIGH (ref 65–99)
Phosphorus: 2.8 mg/dL (ref 2.5–4.6)
Potassium: 3.6 mmol/L (ref 3.5–5.1)
Sodium: 141 mmol/L (ref 135–145)

## 2017-06-30 LAB — MAGNESIUM: Magnesium: 2 mg/dL (ref 1.7–2.4)

## 2017-06-30 LAB — PHOSPHORUS: Phosphorus: 2.4 mg/dL — ABNORMAL LOW (ref 2.5–4.6)

## 2017-06-30 MED ORDER — LORAZEPAM 2 MG/ML IJ SOLN
0.2500 mg | Freq: Three times a day (TID) | INTRAMUSCULAR | Status: DC
  Administered 2017-06-30 – 2017-07-01 (×4): 0.25 mg via INTRAVENOUS
  Filled 2017-06-30 (×4): qty 1

## 2017-06-30 MED ORDER — HALOPERIDOL LACTATE 5 MG/ML IJ SOLN: 0.5000 mg | Freq: Three times a day (TID) | INTRAMUSCULAR | Status: DC | PRN

## 2017-06-30 NOTE — Progress Notes (Signed)
PT Cancellation Note  Patient Details Name: Julie Oliver MRN: [id number] DOB: [birth year]   Cancelled Treatment:    Reason Eval/Treat Not Completed: Fatigue/lethargy limiting ability to participate. Attempted to see for evaluation, son in law at bedside and spoke at length regarding patient, goals of therapy and status. At this time, patient has just received ativan and is unable to engage in therapies, family requesting to hold PT at this time and try back for evaluation tomorrow.    Duncan Dull 06/30/2017, 2:57 PM Alben Deeds, PT DPT  Board Certified Neurologic Specialist [phone]

## 2017-06-30 NOTE — Progress Notes (Signed)
As per Dr. Marthenia Rolling it is ok for  Speech to go ahead and conduct the swallow evaluation.

## 2017-06-30 NOTE — Progress Notes (Signed)
*  PRELIMINARY RESULTS* Echocardiogram Spoke with RN, family agrees to have echo done however, patient being transported to radiology. Told RN we will try tomorrow.  Leavy Cella 06/30/2017, 3:22 PM

## 2017-06-30 NOTE — Progress Notes (Signed)
PROGRESS NOTE    Julie Oliver  KCL:[id number] DOB: 1919/07/12 DOA: 06/28/2017 PCP: Lajean Manes, MD  Outpatient Specialists:    Brief Narrative: Patient is a 83 year old Caucasian female with past medical history significant for HTN, insomnia, SBO, GERD.  Patient was admitted with altered mental status.  Patient was seen today, 06/30/2017, alongside patient's 2 daughters and son-in-law.  The patient could not give any history.  According to the patient's family, the patient has been declining since she had a stroke towards the end of last year.  Since the stroke, patient has had recurrent UTIs, delirium, visual hallucination, describing the presence of someone in her house and seen her dad husband.  Patient was admitted with worsening of the mental status.  Patient was noted to have ileus based on abdominal x-ray done yesterday, 06/29/2017.  The patient also had associated nausea and vomiting.  The patient was febrile.  Urine culture has not grown any organisms.  Chest x-ray and CT scan of the chest without contrast have not revealed any infiltrates.  CT chest without contrast done earlier today revealed bilateral pleural effusion, worse on the left side with atelectasis.  Patient is on IV Zosyn.  Leukocytosis is improving.  No documented fever today, however, T-max over the last 24 hours is 101 F.  Patient seems to be improving, and a bit more communicative today.  Volume depletion is also improving.  Patient's family was updated at length today, and all questions answered.  Repeat abdominal x-ray done today revealed that the ileus has resolved.  There were concerns for nonsustained V. tach yesterday.  Electrolytes have been optimized.  Results of echocardiogram is still pending.  Patient's family informed me that patient has been on benzodiazepine for several years.  Based on that, will start patient on very low-dose Ativan IV.  We will continue to monitor patient's level of awareness.  We will also  use very low-dose haloperidol for agitation.  Will consult palliative care team.  Overall, the patient's prognosis remains guarded.  The family broached option of higher level of care.  The family seemed to be of the opinion that the patient has underlying dementing illness.  We will continue to assess patient.  Further management will depend on hospital course.    Assessment & Plan:   Active Problems:   Altered mental status   #. Altered mental status: -Urine culture has not grown any organisms.   -CT scan of the chest and chest x-ray have not revealed infiltrates. -Volume depletion is improving. -Continue IV antibiotics and supportive care. -Low-dose IV Haldol as needed. -Ileus has resolved. -Continue to assess and manage expectantly.  #. Acute kidney injury: - IV fluids and repeat BMP in AM.  - Avoid nephrotoxic medications - Bladder scan and place foley catheter if evidence of urinary retention  #. History of HTN: - Continue Cozaar, verapamil  #. History of GERD - Continue Prilosec  #. History of CVA - Continue aspirin  #. History of depression - Continue Lexapro  Admission status: Inpatient IV Fluids: NS Diet/Nutrition: NPO DVT Px: Lovenox, SCDs and early ambulation. Code Status: DNR  Disposition Plan: To be determined.   Consultants:   Palliative care  Procedures:   Echo, but result is pending  Antimicrobials:   IV Zosyn   Subjective: Patient seems to be improving.  More communicative today.  Patient is still not able to give coherent history.  Objective: Vitals:   06/29/17 1600 06/29/17 2135 06/30/17 0541 06/30/17 1422  BP:  Marland Kitchen)  185/70 (!) 157/60 (!) 153/105  Pulse:  80 84 72  Resp:  18 18 18   Temp: 98.8 F (37.1 C) 98 F (36.7 C) 99.4 F (37.4 C) 98.9 F (37.2 C)  TempSrc: Axillary  Oral   SpO2:  92% 95% 95%  Weight:      Height:        Intake/Output Summary (Last 24 hours) at 06/30/2017 1739 Last data filed at 06/30/2017  1100 Gross per 24 hour  Intake 0 ml  Output 1100 ml  Net -1100 ml   Filed Weights   06/28/17 2350  Weight: 56.7 kg (125 lb)    Examination:  General exam: Agitation is improving. AMS is improving.  Respiratory system: Decreased air entry globally.   Cardiovascular system: S1 & S2, with systolic murmur.   Gastrointestinal system: Abdomen is nondistended, soft and nontender. No organomegaly or masses felt. Normal bowel sounds heard. Central nervous system: Agitated, but improved significantly.  Patient moves all limbs.   Extremities: No leg edema.  Data Reviewed: I have personally reviewed following labs and imaging studies  CBC: Recent Labs  Lab 06/29/17 0104 06/29/17 0454 06/30/17 0630  WBC 10.0 12.9* 11.1*  NEUTROABS 6.2  --  8.1*  HGB 11.0* 11.9* 10.5*  HCT 34.0* 36.7 32.2*  MCV 92.9 92.0 91.0  PLT 202 201 244   Basic Metabolic Panel: Recent Labs  Lab 06/29/17 0104 06/29/17 0454 06/30/17 0630 06/30/17 1135  NA 140 136 141  --   K 4.2 3.7 3.6  --   CL 106 102 107  --   CO2 26 24 25   --   GLUCOSE 118* 145* 143*  --   BUN 21* 18 18  --   CREATININE 1.34* 1.09* 0.97  --   CALCIUM 8.1* 8.4* 8.4*  --   MG  --  1.8 2.0  --   PHOS  --  2.6 2.8 2.4*   GFR: Estimated Creatinine Clearance: 31.1 mL/min (by C-G formula based on SCr of 0.97 mg/dL). Liver Function Tests: Recent Labs  Lab 06/29/17 0104 06/30/17 0630  AST 25  --   ALT 19  --   ALKPHOS 91  --   BILITOT 0.5  --   PROT 6.0*  --   ALBUMIN 3.0* 2.6*   No results for input(s): LIPASE, AMYLASE in the last 168 hours. No results for input(s): AMMONIA in the last 168 hours. Coagulation Profile: No results for input(s): INR, PROTIME in the last 168 hours. Cardiac Enzymes: No results for input(s): CKTOTAL, CKMB, CKMBINDEX, TROPONINI in the last 168 hours. BNP (last 3 results) No results for input(s): PROBNP in the last 8760 hours. HbA1C: No results for input(s): HGBA1C in the last 72 hours. CBG: No  results for input(s): GLUCAP in the last 168 hours. Lipid Profile: No results for input(s): CHOL, HDL, LDLCALC, TRIG, CHOLHDL, LDLDIRECT in the last 72 hours. Thyroid Function Tests: Recent Labs    06/29/17 0454  TSH 1.739   Anemia Panel: No results for input(s): VITAMINB12, FOLATE, FERRITIN, TIBC, IRON, RETICCTPCT in the last 72 hours. Urine analysis:    Component Value Date/Time   COLORURINE STRAW (A) 06/28/2017 [place] 06/28/2017 2355   LABSPEC 1.008 06/28/2017 2355   PHURINE 6.0 06/28/2017 2355   GLUCOSEU NEGATIVE 06/28/2017 2355   HGBUR SMALL (A) 06/28/2017 2355   BILIRUBINUR NEGATIVE 06/28/2017 2355   BILIRUBINUR neg 08/18/2011 [place] 06/28/2017 2355   PROTEINUR NEGATIVE 06/28/2017 2355  UROBILINOGEN 0.2 02/12/2014 1622   NITRITE NEGATIVE 06/28/2017 2355   LEUKOCYTESUR SMALL (A) 06/28/2017 2355   Sepsis Labs: @LABRCNTIP (procalcitonin:4,lacticidven:4)  ) Recent Results (from the past 240 hour(s))  Urine culture     Status: None   Collection Time: 06/29/17 12:18 AM  Result Value Ref Range Status   Specimen Description URINE, CATHETERIZED  Final   Special Requests NONE  Final   Culture   Final    NO GROWTH Performed at [facility], [place] [address], [place], [place] [address]    Report Status 06/30/2017 FINAL  Final  Blood Culture (routine x 2)     Status: None (Preliminary result)   Collection Time: 06/29/17 12:58 AM  Result Value Ref Range Status   Specimen Description BLOOD RIGHT ANTECUBITAL  Final   Special Requests   Final    BOTTLES DRAWN AEROBIC AND ANAEROBIC Blood Culture adequate volume   Culture   Final    NO GROWTH 1 DAY Performed at [facility], [place] [address] [place], [place] [address]    Report Status PENDING  Incomplete  Blood Culture (routine x 2)     Status: None (Preliminary result)   Collection Time: 06/29/17  1:03 AM  Result Value Ref Range Status   Specimen Description BLOOD RIGHT  ANTECUBITAL  Final   Special Requests   Final    BOTTLES DRAWN AEROBIC ONLY Blood Culture results may not be optimal due to an inadequate volume of blood received in culture bottles   Culture   Final    NO GROWTH 1 DAY Performed at [facility], [place] [address], [place], [place] [address]    Report Status PENDING  Incomplete  MRSA PCR Screening     Status: None   Collection Time: 06/29/17  8:03 AM  Result Value Ref Range Status   MRSA by PCR NEGATIVE NEGATIVE Final    Comment:        The GeneXpert MRSA Assay (FDA approved for NASAL specimens only), is one component of a comprehensive MRSA colonization surveillance program. It is not intended to diagnose MRSA infection nor to guide or monitor treatment for MRSA infections. Performed at [facility], [place] [address], [place], [place] [address]          Radiology Studies: Dg Abd 1 View  Result Date: 06/30/2017 CLINICAL DATA:  Ileus EXAM: ABDOMEN - 1 VIEW COMPARISON:  06/29/2017 FINDINGS: Nonobstructive bowel gas pattern. Mild gaseous distention of the colon, nonspecific. Cholecystectomy clips. Mild degenerative changes of the lower lumbar spine. IMPRESSION: Unremarkable abdominal radiograph. Electronically Signed   By: Julian Hy M.D.   On: 06/30/2017 15:41   Dg Abd 1 View  Result Date: 06/29/2017 CLINICAL DATA:  Nausea and vomiting. EXAM: ABDOMEN - 1 VIEW COMPARISON:  CT abdomen pelvis 05/24/2015 FINDINGS: Gas is demonstrated within dilated loops of large and small bowel throughout the abdomen. Supine evaluation limited for free intraperitoneal air. Stool throughout the colon. Lumbar spine degenerative changes. IMPRESSION: Gaseous distended loops of small and large bowel suggestive of ileus. Electronically Signed   By: Lovey Newcomer M.D.   On: 06/29/2017 13:33   Ct Head Wo Contrast  Result Date: 06/29/2017 CLINICAL DATA:  Acute onset altered mental status. Generalized weakness and lethargy. EXAM: CT HEAD WITHOUT  CONTRAST TECHNIQUE: Contiguous axial images were obtained from the base of the skull through the vertex without intravenous contrast. COMPARISON:  CT of the head performed 08/22/2011, and MRI of the brain  performed 03/15/2017 FINDINGS: Brain: No evidence of acute infarction, hemorrhage, hydrocephalus, extra-axial collection or mass lesion / mass effect. Prominence of the ventricles and sulci reflects mild to moderate cortical volume loss. Chronic lacunar infarcts are noted at the basal ganglia bilaterally. The brainstem and fourth ventricle are within normal limits. The cerebral hemispheres demonstrate grossly normal gray-white differentiation. No mass effect or midline shift is seen. Vascular: No hyperdense vessel or unexpected calcification. Skull: There is no evidence of fracture; visualized osseous structures are unremarkable in appearance. Sinuses/Orbits: The visualized portions of the orbits are within normal limits. Mucosal thickening is noted at the maxillary sinuses bilaterally. The remaining paranasal sinuses and mastoid air cells are well-aerated. Other: No significant soft tissue abnormalities are seen. IMPRESSION: 1. No acute intracranial pathology seen on CT. 2. Mild to moderate cortical volume loss. 3. Chronic lacunar infarcts at the basal ganglia bilaterally. 4. Mucosal thickening at the maxillary sinuses bilaterally. Electronically Signed   By: Garald Balding M.D.   On: 06/29/2017 01:52   Ct Chest Wo Contrast  Result Date: 06/30/2017 CLINICAL DATA:  Pneumonia. EXAM: CT CHEST WITHOUT CONTRAST TECHNIQUE: Multidetector CT imaging of the chest was performed following the standard protocol without IV contrast. COMPARISON:  Radiographs dated 06/29/2017 and 02/27/2017 and 05/23/2015 FINDINGS: Cardiovascular: Extensive aortic atherosclerosis. Overall heart size is normal. No pericardial effusion. Mediastinum/Nodes: Large hiatal hernia. Bilateral low-density lesions in the lower poles of the thyroid  gland. Lungs/Pleura: Moderate right and tiny left pleural effusion. Slight atelectasis at the lung bases. No consolidative infiltrates. No acute abnormality. Small calcified granulomas and calcified lymph nodes on the right. Upper Abdomen: Bilateral renal cysts. Musculoskeletal: Moderate arthritis of the left glenohumeral joint. No acute abnormalities. Chronic Schmorl's node in the superior endplate of L1. IMPRESSION: 1. Moderate right and small left pleural effusions. No infiltrates. Minimal atelectasis at the lung bases, left more than right. 2.  Aortic Atherosclerosis (ICD10-I70.0). Electronically Signed   By: Lorriane Shire M.D.   On: 06/30/2017 12:48   Dg Chest Port 1 View  Result Date: 06/29/2017 CLINICAL DATA:  Pneumonia EXAM: PORTABLE CHEST 1 VIEW COMPARISON:  06/29/2017 FINDINGS: Normal mediastinum and cardiac silhouette. Normal pulmonary vasculature. No evidence of effusion, infiltrate, or pneumothorax. No acute bony abnormality. Degenerative osteophytosis of the spine. Atherosclerotic calcification of the aorta. IMPRESSION: No acute cardiopulmonary process. Aortic Atherosclerosis (ICD10-I70.0). Electronically Signed   By: Suzy Bouchard M.D.   On: 06/29/2017 13:35   Dg Chest Port 1 View  Result Date: 06/29/2017 CLINICAL DATA:  Acute onset of altered mental status. EXAM: PORTABLE CHEST 1 VIEW COMPARISON:  Chest radiograph performed 02/27/2017 FINDINGS: The lungs are well-aerated. Mild vascular congestion is noted. Peripheral atelectasis is noted bilaterally. There is no evidence of pleural effusion or pneumothorax. The cardiomediastinal silhouette is within normal limits. No acute osseous abnormalities are seen. IMPRESSION: Mild vascular congestion noted. Peripheral atelectasis seen bilaterally. Electronically Signed   By: Garald Balding M.D.   On: 06/29/2017 00:57        Scheduled Meds: . aspirin EC  81 mg Oral Daily  . cholecalciferol  2,000 Units Oral Daily  . enoxaparin (LOVENOX)  injection  30 mg Subcutaneous Q24H  . erythromycin  1 application Right Eye Q5Z  . escitalopram  20 mg Oral Daily  . ferrous sulfate  325 mg Oral Q breakfast  . loratadine  10 mg Oral Daily  . LORazepam  0.25 mg Intravenous Q8H  . losartan  100 mg Oral Daily  . mirabegron ER  25  mg Oral QHS  . verapamil  240 mg Oral BID   Continuous Infusions: . cefTRIAXone (ROCEPHIN)  IV Stopped (06/30/17 0630)  . dextrose 5 % and 0.9% NaCl 50 mL/hr at 06/29/17 1025     LOS: 1 day    Time spent: 32 minutes.    Dana Allan, MD  [facility] Pager #: [phone] 7PM-7AM contact night coverage as above

## 2017-06-30 NOTE — Progress Notes (Signed)
SLP Cancellation Note  Patient Details Name: Julie Oliver MRN: [id number] DOB: 1919/08/27   Cancelled treatment:       Reason Eval/Treat Not Completed: Medical issues which prohibited therapy. Abdominal x-ray on previous date suggestive of ileus. Paged MD for clarification about treatment plan. Will hold PO trials until we receive clearance to proceed.    Germain Osgood 06/30/2017, 11:14 AM  Germain Osgood, M.A. CCC-SLP [phone]

## 2017-06-30 NOTE — Plan of Care (Signed)
  Problem: Activity: Goal: Risk for activity intolerance will decrease Outcome: Progressing   Problem: Coping: Goal: Level of anxiety will decrease Outcome: Progressing   Problem: Elimination: Goal: Will not experience complications related to bowel motility Outcome: Progressing   Problem: Safety: Goal: Ability to remain free from injury will improve Outcome: Progressing  Patient remains confused at this time did not sleep overnight continues to fidget and conversation inappropriate at times  Explained to family that patient can not have any sedatives or meds to calm her.

## 2017-06-30 NOTE — Progress Notes (Signed)
  Echocardiogram 2D Echocardiogram was attempted but the patient's family wanted to see the Doctor before the patient had this exam.   Jennette Dubin 06/30/2017, 10:31 AM

## 2017-07-01 ENCOUNTER — Other Ambulatory Visit (HOSPITAL_COMMUNITY): Payer: Self-pay

## 2017-07-01 DIAGNOSIS — N390 Urinary tract infection, site not specified: Secondary | ICD-10-CM

## 2017-07-01 DIAGNOSIS — Z515 Encounter for palliative care: Secondary | ICD-10-CM

## 2017-07-01 DIAGNOSIS — N179 Acute kidney failure, unspecified: Secondary | ICD-10-CM

## 2017-07-01 DIAGNOSIS — R4182 Altered mental status, unspecified: Secondary | ICD-10-CM

## 2017-07-01 DIAGNOSIS — K567 Ileus, unspecified: Secondary | ICD-10-CM

## 2017-07-01 LAB — CBC WITH DIFFERENTIAL/PLATELET
Abs Immature Granulocytes: 0.1 10*3/uL (ref 0.0–0.1)
Basophils Absolute: 0.1 10*3/uL (ref 0.0–0.1)
Basophils Relative: 1 %
Eosinophils Absolute: 0.2 10*3/uL (ref 0.0–0.7)
Eosinophils Relative: 2 %
HCT: 31.1 % — ABNORMAL LOW (ref 36.0–46.0)
Hemoglobin: 10.2 g/dL — ABNORMAL LOW (ref 12.0–15.0)
Immature Granulocytes: 1 %
Lymphocytes Relative: 17 %
Lymphs Abs: 1.6 10*3/uL (ref 0.7–4.0)
MCH: 30.2 pg (ref 26.0–34.0)
MCHC: 32.8 g/dL (ref 30.0–36.0)
MCV: 92 fL (ref 78.0–100.0)
Monocytes Absolute: 1.4 10*3/uL — ABNORMAL HIGH (ref 0.1–1.0)
Monocytes Relative: 15 %
Neutro Abs: 6.1 10*3/uL (ref 1.7–7.7)
Neutrophils Relative %: 66 %
Platelets: 201 10*3/uL (ref 150–400)
RBC: 3.38 MIL/uL — ABNORMAL LOW (ref 3.87–5.11)
RDW: 13.8 % (ref 11.5–15.5)
WBC: 9.3 10*3/uL (ref 4.0–10.5)

## 2017-07-01 LAB — RENAL FUNCTION PANEL
Albumin: 2.5 g/dL — ABNORMAL LOW (ref 3.5–5.0)
Anion gap: 7 (ref 5–15)
BUN: 18 mg/dL (ref 6–20)
CO2: 24 mmol/L (ref 22–32)
Calcium: 8.3 mg/dL — ABNORMAL LOW (ref 8.9–10.3)
Chloride: 110 mmol/L (ref 101–111)
Creatinine, Ser: 0.9 mg/dL (ref 0.44–1.00)
GFR calc Af Amer: >60 mL/min (ref >60)
GFR calc non Af Amer: 53 mL/min — ABNORMAL LOW (ref >60)
Glucose, Bld: 144 mg/dL — ABNORMAL HIGH (ref 65–99)
Phosphorus: 2.7 mg/dL (ref 2.5–4.6)
Potassium: 3.3 mmol/L — ABNORMAL LOW (ref 3.5–5.1)
Sodium: 141 mmol/L (ref 135–145)

## 2017-07-01 LAB — MAGNESIUM: Magnesium: 2.1 mg/dL (ref 1.7–2.4)

## 2017-07-01 MED ORDER — MORPHINE SULFATE (CONCENTRATE) 10 MG/0.5ML PO SOLN: 5.0000 mg | ORAL | Status: DC | PRN

## 2017-07-01 MED ORDER — LORAZEPAM 2 MG/ML PO CONC: 0.5000 mg | Freq: Four times a day (QID) | ORAL | Status: DC | PRN

## 2017-07-01 MED ORDER — HALOPERIDOL 0.5 MG PO TABS
0.5000 mg | ORAL_TABLET | ORAL | Status: DC | PRN
Start: 2017-07-01 — End: 2017-07-01
  Filled 2017-07-01: qty 1

## 2017-07-01 MED ORDER — HALOPERIDOL 0.5 MG PO TABS: 0.5000 mg | ORAL_TABLET | ORAL | Status: DC | PRN

## 2017-07-01 MED ORDER — HALOPERIDOL LACTATE 5 MG/ML IJ SOLN: 0.5000 mg | INTRAMUSCULAR | Status: DC | PRN

## 2017-07-01 MED ORDER — POLYVINYL ALCOHOL 1.4 % OP SOLN: 1.0000 [drp] | Freq: Four times a day (QID) | OPHTHALMIC | Status: DC | PRN

## 2017-07-01 MED ORDER — SODIUM CHLORIDE 0.9 % IV SOLN
INTRAVENOUS | Status: DC
  Administered 2017-07-01: 10:00:00 via INTRAVENOUS

## 2017-07-01 MED ORDER — GLYCOPYRROLATE 0.2 MG/ML IJ SOLN: 0.2000 mg | INTRAMUSCULAR | Status: DC | PRN

## 2017-07-01 MED ORDER — BIOTENE DRY MOUTH MT LIQD: 15.0000 mL | OROMUCOSAL | Status: DC | PRN

## 2017-07-01 MED ORDER — HALOPERIDOL LACTATE 2 MG/ML PO CONC
0.5000 mg | ORAL | Status: DC | PRN
  Filled 2017-07-01: qty 0.3

## 2017-07-01 MED ORDER — LORAZEPAM 2 MG/ML PO CONC: 0.5000 mg | Freq: Four times a day (QID) | ORAL | 0 refills | Status: DC | PRN

## 2017-07-01 MED ORDER — GLYCOPYRROLATE 1 MG PO TABS: 1.0000 mg | ORAL_TABLET | ORAL | Status: DC | PRN

## 2017-07-01 MED ORDER — GLYCOPYRROLATE 1 MG PO TABS
1.0000 mg | ORAL_TABLET | ORAL | Status: DC | PRN
Start: 2017-07-01 — End: 2017-07-01

## 2017-07-01 NOTE — Consult Note (Signed)
Consultation Note Date: 07/01/2017   Patient Name: Julie Oliver  DOB: 13-Dec-1919  MRN: [id number]  Age / Sex: 84 y.o., female  PCP: Lajean Manes, MD Referring Physician: Jonetta Osgood, MD  Reason for Consultation: Establishing goals of care, Non pain symptom management and Psychosocial/spiritual support  HPI/Patient Profile: 84 y.o. female  with past medical history of benzodiazepine dependence, CVA, SBO, insomnia, and more recently recurrent UTIs and dementia who was admitted on 06/28/2017 with fever and altered mental status.  She was found to have ileus and pyuria. Per her family she has rapidly declined in the past week and has been having significant hallucinations.     Clinical Assessment and Goals of Care:  I have reviewed medical records including EPIC notes, labs and imaging, received report from the care team, assessed the patient and then met at the bedside along with her two daughters Louann Liv and Ralph Leyden as well as her son in law Ulice Dash  to discuss diagnosis prognosis, [place], EOL wishes, disposition and options.  I introduced Palliative Medicine as specialized medical care for people living with serious illness. It focuses on providing relief from the symptoms and stress of a serious illness. The goal is to improve quality of life for both the patient and the family.  We discussed a brief life review of the patient. She is a holocaust survivor and has lived the last 6.5 years at [facility].  Per her family she was always the life of the party.  This changed after her stroke in December of 2018.   She became much more quiet and her personality became flat.  More recently she was still independent with ADLs but had become confused and forgetful.  Her daughter took over management of her medications and finances.  Since last Saturday she has declined very rapidly.  Having vivid  hallucinations, barely eating, experiencing lethargy.  I talked thru the diagnostic results obtained this hospitalization with the family.  We discussed her functionality, nutritional status and her spirit.  The family feels that Mrs. Galik will not bounce back from this illness.  I offered a prognosis of days to possibly two weeks. They asked about options.  We discussed Hospice House The family requests [employer].   Questions and concerns were addressed.   The family was encouraged to call with questions or concerns.     Primary Decision Maker:  NEXT OF KIN [name] and Lilly    SUMMARY OF RECOMMENDATIONS     Shift to full comfort.  (Orders changed)  Patient is benzodiazepine dependent.  We will need to continue scheduled ativan (she was on 0.5 BID at home).  Would consider olanzepine for delirium if haldol is not effective.  Order placed to social work for [employer].  Anticipate DC today.  Code Status/Advance Care Planning:  DNR   Prognosis:  In the setting of previous strokes, lethargy and hallucinations as well as advanced age - no longer eating more than bites and sips - likely days to possibly 2 weeks.  Discharge Planning: Hospice facility      Primary Diagnoses: Present on Admission: . Altered mental status   I have reviewed the medical record, interviewed the patient and family, and examined the patient. The following aspects are pertinent.  Past Medical History:  Diagnosis Date  . Gastro - esophageal reflux disease   . Hypertension   . Insomnia   . Small bowel obstruction (HCC)    Social History   Socioeconomic History  . Marital status: Widowed    Spouse name: Not on file  . Number of children: Not on file  . Years of education: Not on file  . Highest education level: Not on file  Occupational History  . Not on file  Social Needs  . Financial resource strain: Not on file  . Food insecurity:    Worry: Not on file    Inability: Not on  file  . Transportation needs:    Medical: Not on file    Non-medical: Not on file  Tobacco Use  . Smoking status: Never Smoker  . Smokeless tobacco: Never Used  Substance and Sexual Activity  . Alcohol use: No  . Drug use: No  . Sexual activity: Never  Lifestyle  . Physical activity:    Days per week: Not on file    Minutes per session: Not on file  . Stress: Not on file  Relationships  . Social connections:    Talks on phone: Not on file    Gets together: Not on file    Attends religious service: Not on file    Active member of club or organization: Not on file    Attends meetings of clubs or organizations: Not on file    Relationship status: Not on file  Other Topics Concern  . Not on file  Social History Narrative  . Not on file   No family history on file. Scheduled Meds: . erythromycin  1 application Right Eye L7L  . escitalopram  20 mg Oral Daily  . LORazepam  0.25 mg Intravenous Q8H  . mirabegron ER  25 mg Oral QHS  . verapamil  240 mg Oral BID   Continuous Infusions: . sodium chloride     PRN Meds:.acetaminophen **OR** acetaminophen, antiseptic oral rinse, bisacodyl, glycopyrrolate **OR** glycopyrrolate **OR** glycopyrrolate, haloperidol **OR** haloperidol **OR** haloperidol lactate, LORazepam, morphine CONCENTRATE **OR** morphine CONCENTRATE, ondansetron **OR** ondansetron (ZOFRAN) IV, polyvinyl alcohol, senna-docusate No Known Allergies Review of Systems unable to give  Physical Exam  Elderly female.  Snoring.  Does not wake to my exam CV rrr resp no distress Abd soft with slight distention, quiet bowel sounds, NT Ext no edema  Vital Signs: BP (!) 156/139 (BP Location: Left Arm)   Pulse 70   Temp 98.8 F (37.1 C) (Oral)   Resp 20   Ht '5\' 7"'$  (1.702 m)   Wt 56.7 kg (125 lb)   SpO2 92%   BMI 19.58 kg/m  Pain Scale: 0-10   Pain Score: Asleep   SpO2: SpO2: 92 % O2 Device:SpO2: 92 % O2 Flow Rate: .   IO: Intake/output summary:    Intake/Output Summary (Last 24 hours) at 07/01/2017 0856 Last data filed at 07/01/2017 8921 Gross per 24 hour  Intake 0 ml  Output 500 ml  Net -500 ml    LBM: Last BM Date: 06/29/17 Baseline Weight: Weight: 56.7 kg (125 lb) Most recent weight: Weight: 56.7 kg (125 lb)     Palliative Assessment/Data: 20%     Time In: 8:00  Time Out: 9:10 Time Total: 1  Min. Greater than 50%  of this time was spent counseling and coordinating care related to the above assessment and plan.  Signed by: Florentina Jenny, PA-C Palliative Medicine Pager: [phone]  Please contact Palliative Medicine Team phone at [phone] for questions and concerns.  For individual provider: See Shea Evans

## 2017-07-01 NOTE — Progress Notes (Addendum)
Lavona Mound to be D/C'd to [facility] per MD order.  Discussed with the patient and all questions fully answered. VSS, Skin clean, dry and intact without evidence of skin break down, no evidence of skin tears noted.  IV catheter discontinued intact. Site without signs and symptoms of complications. Dressing and pressure applied.  An After Visit Summary was printed and given to the patient. Given report to RN at [facility]  D/c education completed with patient/family including follow up instructions, medication list, d/c activities limitations if indicated, with other d/c instructions as indicated by MD - patient able to verbalize understanding, all questions fully answered.    Patient escorted via Alroy Bailiff 07/01/2017 1:24 PM

## 2017-07-01 NOTE — Discharge Summary (Signed)
PATIENT DETAILS Name: Julie Oliver Age: 84 y.o. Sex: female Date of Birth: Aug 22, 1919 MRN: [id number]. Admitting Physician: Harvie Bridge, DO DXI:[id number], Christiane Ha, MD  Admit Date: 06/28/2017 Discharge date: 07/01/2017  Recommendations for Outpatient Follow-up:  1. Transfer to residential hospice for comfort care  Admitted From:  Home  Disposition: High Rolls: No  Equipment/Devices: None  Discharge Condition: hospice  CODE STATUS:  DNR  Diet recommendation:  Comfort feedings  Brief Summary: See H&P, Labs, Consult and Test reports for all details in brief, patient is a 84 year old female with recent history of CVA this past December-and delirium/hallucination intermittently since then-apparently has developed significant failure to thrive symptoms over the past few months-admitted to the hospitalist service for worsening altered mental status.  Seen with worsening mentation since then-admitted with altered mental status  Brief Hospital Course: Altered mental status: Initially felt to have UTI based on UA-but urine cultures are negative.  Now thought to have probable vascular dementia with significant deterioration and failure to thrive syndrome in the past few months.  Per family, patient has not had any significant oral intake for the past 3 days.  Evaluated by palliative care-family has decided to transition to full comfort measures.  Stable to transition to comfort care be can place at this time  Acute kidney injury: Likely mild hemodynamically mediated kidney injury in the setting of poor oral intake, resolved with supportive care.  Asymptomatic bacteriuria: Initially thought to have UTI-but cultures negative-no other symptoms of UTI.  No longer on any antimicrobial therapy.  Recent history of CVA with significant delirium/hallucination since December 2018: Per family ever since she had her CVA she has just not been a usual self-she has  developed confusion/hallucinations-mostly visual for the past few months.  She also appears to have developed urinary incontinence in the past few weeks as well.  Suspect she may have vascular dementia-and now superimposed failure to thrive syndrome that has caused her to deteriorate rapidly.  In any event, family has decided to transition to full comfort measures.  Ileus: Resolved on subsequent imaging.  No further work-up required.  Hypertension: Stop all antihypertensives as being transition to full comfort measures.  GERD: Stop all PPI as being transition to full comfort measures.  Family communication: 2 daughters and son-in-law at bedside  Procedures/Studies: None  Discharge Diagnoses:  Active Problems:   Altered mental status   Ileus ([place])   Lower urinary tract infectious disease   Palliative care encounter   Comfort measures only status   Discharge Instructions:  Activity:  As tolerated  Discharge Instructions    Diet - low sodium heart healthy   Complete by:  As directed    Comfort diet as tolerated     Allergies as of 07/01/2017   No Known Allergies     Medication List    STOP taking these medications   amLODipine 2.5 MG tablet Commonly known as:  NORVASC   aspirin EC 81 MG tablet   escitalopram 20 MG tablet Commonly known as:  LEXAPRO   ferrous sulfate 325 (65 FE) MG tablet   loratadine 10 MG tablet Commonly known as:  CLARITIN   LORazepam 0.5 MG tablet Commonly known as:  ATIVAN Replaced by:  LORazepam 2 MG/ML concentrated solution   losartan 50 MG tablet Commonly known as:  COZAAR   mirtazapine 7.5 MG tablet Commonly known as:  REMERON   MYRBETRIQ 25 MG Tb24 tablet Generic drug:  mirabegron ER   omeprazole  20 MG capsule Commonly known as:  PRILOSEC   PRESERVISION AREDS 2 PO   triazolam 0.25 MG tablet Commonly known as:  HALCION   verapamil 240 MG CR tablet Commonly known as:  CALAN-SR   Vitamin D 2000 units tablet     TAKE  these medications   glycopyrrolate 1 MG tablet Commonly known as:  ROBINUL Take 1 tablet (1 mg total) by mouth every 4 (four) hours as needed (excessive secretions).   haloperidol 0.5 MG tablet Commonly known as:  HALDOL Take 1 tablet (0.5 mg total) by mouth every 4 (four) hours as needed for agitation (or delirium).   LORazepam 2 MG/ML concentrated solution Commonly known as:  ATIVAN Take 0.3 mLs (0.6 mg total) by mouth every 6 (six) hours as needed for anxiety or sleep. Replaces:  LORazepam 0.5 MG tablet   morphine CONCENTRATE 10 MG/0.5ML Soln concentrated solution Place 0.25 mLs (5 mg total) under the tongue every 2 (two) hours as needed for moderate pain (or dyspnea).      Follow-up Information    Stoneking, Christiane Ha, MD. Schedule an appointment as soon as possible for a visit.   Specialty:  Internal Medicine Why:  as needed Contact information: [id number] [place]  [id number]          No Known Allergies  Consultations:   Palliative care  Other Procedures/Studies: Dg Abd 1 View  Result Date: 06/30/2017 CLINICAL DATA:  Ileus EXAM: ABDOMEN - 1 VIEW COMPARISON:  06/29/2017 FINDINGS: Nonobstructive bowel gas pattern. Mild gaseous distention of the colon, nonspecific. Cholecystectomy clips. Mild degenerative changes of the lower lumbar spine. IMPRESSION: Unremarkable abdominal radiograph. Electronically Signed   By: Julian Hy M.D.   On: 06/30/2017 15:41   Dg Abd 1 View  Result Date: 06/29/2017 CLINICAL DATA:  Nausea and vomiting. EXAM: ABDOMEN - 1 VIEW COMPARISON:  CT abdomen pelvis 05/24/2015 FINDINGS: Gas is demonstrated within dilated loops of large and small bowel throughout the abdomen. Supine evaluation limited for free intraperitoneal air. Stool throughout the colon. Lumbar spine degenerative changes. IMPRESSION: Gaseous distended loops of small and large bowel suggestive of ileus. Electronically Signed   By: Lovey Newcomer M.D.   On: 06/29/2017 13:33   Ct  Head Wo Contrast  Result Date: 06/29/2017 CLINICAL DATA:  Acute onset altered mental status. Generalized weakness and lethargy. EXAM: CT HEAD WITHOUT CONTRAST TECHNIQUE: Contiguous axial images were obtained from the base of the skull through the vertex without intravenous contrast. COMPARISON:  CT of the head performed 08/22/2011, and MRI of the brain performed 03/15/2017 FINDINGS: Brain: No evidence of acute infarction, hemorrhage, hydrocephalus, extra-axial collection or mass lesion / mass effect. Prominence of the ventricles and sulci reflects mild to moderate cortical volume loss. Chronic lacunar infarcts are noted at the basal ganglia bilaterally. The brainstem and fourth ventricle are within normal limits. The cerebral hemispheres demonstrate grossly normal gray-white differentiation. No mass effect or midline shift is seen. Vascular: No hyperdense vessel or unexpected calcification. Skull: There is no evidence of fracture; visualized osseous structures are unremarkable in appearance. Sinuses/Orbits: The visualized portions of the orbits are within normal limits. Mucosal thickening is noted at the maxillary sinuses bilaterally. The remaining paranasal sinuses and mastoid air cells are well-aerated. Other: No significant soft tissue abnormalities are seen. IMPRESSION: 1. No acute intracranial pathology seen on CT. 2. Mild to moderate cortical volume loss. 3. Chronic lacunar infarcts at the basal ganglia bilaterally. 4. Mucosal thickening at the maxillary sinuses bilaterally. Electronically Signed  By: Garald Balding M.D.   On: 06/29/2017 01:52   Ct Chest Wo Contrast  Result Date: 06/30/2017 CLINICAL DATA:  Pneumonia. EXAM: CT CHEST WITHOUT CONTRAST TECHNIQUE: Multidetector CT imaging of the chest was performed following the standard protocol without IV contrast. COMPARISON:  Radiographs dated 06/29/2017 and 02/27/2017 and 05/23/2015 FINDINGS: Cardiovascular: Extensive aortic atherosclerosis. Overall  heart size is normal. No pericardial effusion. Mediastinum/Nodes: Large hiatal hernia. Bilateral low-density lesions in the lower poles of the thyroid gland. Lungs/Pleura: Moderate right and tiny left pleural effusion. Slight atelectasis at the lung bases. No consolidative infiltrates. No acute abnormality. Small calcified granulomas and calcified lymph nodes on the right. Upper Abdomen: Bilateral renal cysts. Musculoskeletal: Moderate arthritis of the left glenohumeral joint. No acute abnormalities. Chronic Schmorl's node in the superior endplate of L1. IMPRESSION: 1. Moderate right and small left pleural effusions. No infiltrates. Minimal atelectasis at the lung bases, left more than right. 2.  Aortic Atherosclerosis (ICD10-I70.0). Electronically Signed   By: Lorriane Shire M.D.   On: 06/30/2017 12:48   Dg Chest Port 1 View  Result Date: 06/29/2017 CLINICAL DATA:  Pneumonia EXAM: PORTABLE CHEST 1 VIEW COMPARISON:  06/29/2017 FINDINGS: Normal mediastinum and cardiac silhouette. Normal pulmonary vasculature. No evidence of effusion, infiltrate, or pneumothorax. No acute bony abnormality. Degenerative osteophytosis of the spine. Atherosclerotic calcification of the aorta. IMPRESSION: No acute cardiopulmonary process. Aortic Atherosclerosis (ICD10-I70.0). Electronically Signed   By: Suzy Bouchard M.D.   On: 06/29/2017 13:35   Dg Chest Port 1 View  Result Date: 06/29/2017 CLINICAL DATA:  Acute onset of altered mental status. EXAM: PORTABLE CHEST 1 VIEW COMPARISON:  Chest radiograph performed 02/27/2017 FINDINGS: The lungs are well-aerated. Mild vascular congestion is noted. Peripheral atelectasis is noted bilaterally. There is no evidence of pleural effusion or pneumothorax. The cardiomediastinal silhouette is within normal limits. No acute osseous abnormalities are seen. IMPRESSION: Mild vascular congestion noted. Peripheral atelectasis seen bilaterally. Electronically Signed   By: Garald Balding M.D.   On:  06/29/2017 00:57      TODAY-DAY OF DISCHARGE:  Subjective:   Julie Oliver today is lethargic-but was arousable and was able to participate somewhat when I saw her this morning but then she went back to sleep.  Per family no significant oral intake for the past 3 days  Objective:   Blood pressure (!) 158/140, pulse 70, temperature 98.8 F (37.1 C), temperature source Oral, resp. rate 20, height 5\' 7"  (1.702 m), weight 56.7 kg (125 lb), SpO2 92 %.  Intake/Output Summary (Last 24 hours) at 07/01/2017 1040 Last data filed at 07/01/2017 0900 Gross per 24 hour  Intake 0 ml  Output 500 ml  Net -500 ml   Filed Weights   06/28/17 2350  Weight: 56.7 kg (125 lb)    Exam: Awake-frail looking. Supple Neck, Symmetrical Chest wall movement, Good air movement bilaterally, CTAB RRR,No Gallops,Rubs or new Murmurs, No Parasternal Heave +ve B.Sounds, Abd Soft, Non tender, No organomegaly appriciated, No rebound -guarding or rigidity. No Cyanosis, Clubbing or edema, No new Rash or bruise   PERTINENT RADIOLOGIC STUDIES: Dg Abd 1 View  Result Date: 06/30/2017 CLINICAL DATA:  Ileus EXAM: ABDOMEN - 1 VIEW COMPARISON:  06/29/2017 FINDINGS: Nonobstructive bowel gas pattern. Mild gaseous distention of the colon, nonspecific. Cholecystectomy clips. Mild degenerative changes of the lower lumbar spine. IMPRESSION: Unremarkable abdominal radiograph. Electronically Signed   By: Julian Hy M.D.   On: 06/30/2017 15:41   Dg Abd 1 View  Result Date: 06/29/2017 CLINICAL DATA:  Nausea and vomiting. EXAM: ABDOMEN - 1 VIEW COMPARISON:  CT abdomen pelvis 05/24/2015 FINDINGS: Gas is demonstrated within dilated loops of large and small bowel throughout the abdomen. Supine evaluation limited for free intraperitoneal air. Stool throughout the colon. Lumbar spine degenerative changes. IMPRESSION: Gaseous distended loops of small and large bowel suggestive of ileus. Electronically Signed   By: Lovey Newcomer M.D.    On: 06/29/2017 13:33   Ct Head Wo Contrast  Result Date: 06/29/2017 CLINICAL DATA:  Acute onset altered mental status. Generalized weakness and lethargy. EXAM: CT HEAD WITHOUT CONTRAST TECHNIQUE: Contiguous axial images were obtained from the base of the skull through the vertex without intravenous contrast. COMPARISON:  CT of the head performed 08/22/2011, and MRI of the brain performed 03/15/2017 FINDINGS: Brain: No evidence of acute infarction, hemorrhage, hydrocephalus, extra-axial collection or mass lesion / mass effect. Prominence of the ventricles and sulci reflects mild to moderate cortical volume loss. Chronic lacunar infarcts are noted at the basal ganglia bilaterally. The brainstem and fourth ventricle are within normal limits. The cerebral hemispheres demonstrate grossly normal gray-white differentiation. No mass effect or midline shift is seen. Vascular: No hyperdense vessel or unexpected calcification. Skull: There is no evidence of fracture; visualized osseous structures are unremarkable in appearance. Sinuses/Orbits: The visualized portions of the orbits are within normal limits. Mucosal thickening is noted at the maxillary sinuses bilaterally. The remaining paranasal sinuses and mastoid air cells are well-aerated. Other: No significant soft tissue abnormalities are seen. IMPRESSION: 1. No acute intracranial pathology seen on CT. 2. Mild to moderate cortical volume loss. 3. Chronic lacunar infarcts at the basal ganglia bilaterally. 4. Mucosal thickening at the maxillary sinuses bilaterally. Electronically Signed   By: Garald Balding M.D.   On: 06/29/2017 01:52   Ct Chest Wo Contrast  Result Date: 06/30/2017 CLINICAL DATA:  Pneumonia. EXAM: CT CHEST WITHOUT CONTRAST TECHNIQUE: Multidetector CT imaging of the chest was performed following the standard protocol without IV contrast. COMPARISON:  Radiographs dated 06/29/2017 and 02/27/2017 and 05/23/2015 FINDINGS: Cardiovascular: Extensive aortic  atherosclerosis. Overall heart size is normal. No pericardial effusion. Mediastinum/Nodes: Large hiatal hernia. Bilateral low-density lesions in the lower poles of the thyroid gland. Lungs/Pleura: Moderate right and tiny left pleural effusion. Slight atelectasis at the lung bases. No consolidative infiltrates. No acute abnormality. Small calcified granulomas and calcified lymph nodes on the right. Upper Abdomen: Bilateral renal cysts. Musculoskeletal: Moderate arthritis of the left glenohumeral joint. No acute abnormalities. Chronic Schmorl's node in the superior endplate of L1. IMPRESSION: 1. Moderate right and small left pleural effusions. No infiltrates. Minimal atelectasis at the lung bases, left more than right. 2.  Aortic Atherosclerosis (ICD10-I70.0). Electronically Signed   By: Lorriane Shire M.D.   On: 06/30/2017 12:48   Dg Chest Port 1 View  Result Date: 06/29/2017 CLINICAL DATA:  Pneumonia EXAM: PORTABLE CHEST 1 VIEW COMPARISON:  06/29/2017 FINDINGS: Normal mediastinum and cardiac silhouette. Normal pulmonary vasculature. No evidence of effusion, infiltrate, or pneumothorax. No acute bony abnormality. Degenerative osteophytosis of the spine. Atherosclerotic calcification of the aorta. IMPRESSION: No acute cardiopulmonary process. Aortic Atherosclerosis (ICD10-I70.0). Electronically Signed   By: Suzy Bouchard M.D.   On: 06/29/2017 13:35   Dg Chest Port 1 View  Result Date: 06/29/2017 CLINICAL DATA:  Acute onset of altered mental status. EXAM: PORTABLE CHEST 1 VIEW COMPARISON:  Chest radiograph performed 02/27/2017 FINDINGS: The lungs are well-aerated. Mild vascular congestion is noted. Peripheral atelectasis is noted bilaterally. There is no evidence of pleural effusion or pneumothorax.  The cardiomediastinal silhouette is within normal limits. No acute osseous abnormalities are seen. IMPRESSION: Mild vascular congestion noted. Peripheral atelectasis seen bilaterally. Electronically Signed   By:  Garald Balding M.D.   On: 06/29/2017 00:57     PERTINENT LAB RESULTS: CBC: Recent Labs    06/30/17 0630 07/01/17 0400  WBC 11.1* 9.3  HGB 10.5* 10.2*  HCT 32.2* 31.1*  PLT 194 201   CMET CMP     Component Value Date/Time   NA 141 07/01/2017 0400   K 3.3 (L) 07/01/2017 0400   CL 110 07/01/2017 0400   CO2 24 07/01/2017 0400   GLUCOSE 144 (H) 07/01/2017 0400   BUN 18 07/01/2017 0400   CREATININE 0.90 07/01/2017 0400   CALCIUM 8.3 (L) 07/01/2017 0400   PROT 6.0 (L) 06/29/2017 0104   ALBUMIN 2.5 (L) 07/01/2017 0400   AST 25 06/29/2017 0104   ALT 19 06/29/2017 0104   ALKPHOS 91 06/29/2017 0104   BILITOT 0.5 06/29/2017 0104   GFRNONAA 53 (L) 07/01/2017 0400   GFRAA >60 07/01/2017 0400    GFR Estimated Creatinine Clearance: 33.5 mL/min (by C-G formula based on SCr of 0.9 mg/dL). No results for input(s): LIPASE, AMYLASE in the last 72 hours. No results for input(s): CKTOTAL, CKMB, CKMBINDEX, TROPONINI in the last 72 hours. Invalid input(s): POCBNP No results for input(s): DDIMER in the last 72 hours. No results for input(s): HGBA1C in the last 72 hours. No results for input(s): CHOL, HDL, LDLCALC, TRIG, CHOLHDL, LDLDIRECT in the last 72 hours. Recent Labs    06/29/17 0454  TSH 1.739   No results for input(s): VITAMINB12, FOLATE, FERRITIN, TIBC, IRON, RETICCTPCT in the last 72 hours. Coags: No results for input(s): INR in the last 72 hours.  Invalid input(s): PT Microbiology: Recent Results (from the past 240 hour(s))  Urine culture     Status: None   Collection Time: 06/29/17 12:18 AM  Result Value Ref Range Status   Specimen Description URINE, CATHETERIZED  Final   Special Requests NONE  Final   Culture   Final    NO GROWTH Performed at [facility], [address], [place], [place] [address]    Report Status 06/30/2017 FINAL  Final  Blood Culture (routine x 2)     Status: None (Preliminary result)   Collection Time: 06/29/17 12:58 AM  Result Value  Ref Range Status   Specimen Description BLOOD RIGHT ANTECUBITAL  Final   Special Requests   Final    BOTTLES DRAWN AEROBIC AND ANAEROBIC Blood Culture adequate volume   Culture   Final    NO GROWTH 1 DAY Performed at [facility], [place] [address], [place], [place] [address]    Report Status PENDING  Incomplete  Blood Culture (routine x 2)     Status: None (Preliminary result)   Collection Time: 06/29/17  1:03 AM  Result Value Ref Range Status   Specimen Description BLOOD RIGHT ANTECUBITAL  Final   Special Requests   Final    BOTTLES DRAWN AEROBIC ONLY Blood Culture results may not be optimal due to an inadequate volume of blood received in culture bottles   Culture   Final    NO GROWTH 1 DAY Performed at [facility], [place] [address] [place], [place] [address]    Report Status PENDING  Incomplete  MRSA PCR Screening     Status: None   Collection Time: 06/29/17  8:03 AM  Result Value Ref Range Status  MRSA by PCR NEGATIVE NEGATIVE Final    Comment:        The GeneXpert MRSA Assay (FDA approved for NASAL specimens only), is one component of a comprehensive MRSA colonization surveillance program. It is not intended to diagnose MRSA infection nor to guide or monitor treatment for MRSA infections. Performed at [facility], [place] [address] [place], [place] [address]     FURTHER DISCHARGE INSTRUCTIONS:  Get Medicines reviewed and adjusted: Please take all your medications with you for your next visit with your Primary MD  Laboratory/radiological data: Please request your Primary MD to go over all hospital tests and procedure/radiological results at the follow up, please ask your Primary MD to get all Hospital records sent to his/her office.  In some cases, they will be blood work, cultures and biopsy results pending at the time of your discharge. Please request that your primary care M.D. goes through all the records of your hospital data and follows up on  these results.  Also Note the following: If you experience worsening of your admission symptoms, develop shortness of breath, life threatening emergency, suicidal or homicidal thoughts you must seek medical attention immediately by calling 911 or calling your MD immediately  if symptoms less severe.  You must read complete instructions/literature along with all the possible adverse reactions/side effects for all the Medicines you take and that have been prescribed to you. Take any new Medicines after you have completely understood and accpet all the possible adverse reactions/side effects.   Do not drive when taking Pain medications or sleeping medications (Benzodaizepines)  Do not take more than prescribed Pain, Sleep and Anxiety Medications. It is not advisable to combine anxiety,sleep and pain medications without talking with your primary care practitioner  Special Instructions: If you have smoked or chewed Tobacco  in the last 2 yrs please stop smoking, stop any regular Alcohol  and or any Recreational drug use.  Wear Seat belts while driving.  Please note: You were cared for by a hospitalist during your hospital stay. Once you are discharged, your primary care physician will handle any further medical issues. Please note that NO REFILLS for any discharge medications will be authorized once you are discharged, as it is imperative that you return to your primary care physician (or establish a relationship with a primary care physician if you do not have one) for your post hospital discharge needs so that they can reassess your need for medications and monitor your lab values.  Total Time spent coordinating discharge including counseling, education and face to face time equals 45 minutes.  SignedOren Binet 07/01/2017 10:40 AM

## 2017-07-01 NOTE — Consult Note (Signed)
            [facility] CM Primary Care Navigator  07/01/2017  Julie Oliver 1920-01-09 [id number]   Went to see patient at the bedside but she was fast asleep; was able to meet with son in-law Ulice Dash) to identify possible discharge needs.  Son in-law confirmed that patient is transferring to a residential hospice ([facility]) for comfort care. Noted no health management needs at this point.  Son in-law commended the staff and service received here. Staff notified.   For additional questions please contact:  Edwena Felty A. Pecolia Marando, BSN, RN-BC [facility] PRIMARY CARE Navigator Cell: [phone]

## 2017-07-01 NOTE — Progress Notes (Addendum)
12:40pm-Patient has been accepted at [facility]. Patient's daughter expressed understanding of the process and is grateful for the time with her mother. Will arrange transfer.   9am-CSW received referral regarding residential hospice placement. Referral sent to [facility] for assessment.   Percell Locus Braxton Weisbecker LCSW [phone]

## 2017-07-01 NOTE — Progress Notes (Signed)
Patient will DC to: [facility] Anticipated DC date: 07/01/17 Family notified: Daughter Transport by: Corey Harold 2:30pm   Per MD patient ready for DC to [facility]. RN, patient, patient's family, and facility notified of DC. Discharge Summary sent to facility. RN given number for report [phone]). DC packet on chart. Ambulance transport requested for patient.   CSW signing off.  Cedric Fishman, LCSW Clinical Social Worker [phone]

## 2017-07-01 NOTE — Progress Notes (Addendum)
[facility])   Received request from PMT PA and CSW for family interest in [facility]. Chart reviewed and spoke with daughter Louann Liv by phone to confirm interest. Plan to meet to complete paper work at 10:45.    Paper work complete. Discharge summary sent.   RN please call report to [phone].   Thank you,  Erling Conte, LCSW [phone]

## 2017-07-04 LAB — CULTURE, BLOOD (ROUTINE X 2)
CULTURE: NO GROWTH
CULTURE: NO GROWTH
Special Requests: ADEQUATE

## 2017-08-11 DIAGNOSIS — I10 Essential (primary) hypertension: Secondary | ICD-10-CM | POA: Diagnosis not present

## 2017-08-11 DIAGNOSIS — I672 Cerebral atherosclerosis: Secondary | ICD-10-CM | POA: Diagnosis not present

## 2017-08-11 DIAGNOSIS — I679 Cerebrovascular disease, unspecified: Secondary | ICD-10-CM | POA: Diagnosis not present

## 2017-08-11 DIAGNOSIS — F339 Major depressive disorder, recurrent, unspecified: Secondary | ICD-10-CM | POA: Diagnosis not present

## 2017-08-18 ENCOUNTER — Emergency Department (HOSPITAL_COMMUNITY)

## 2017-08-18 ENCOUNTER — Emergency Department (HOSPITAL_COMMUNITY)
Admission: EM | Admit: 2017-08-18 | Discharge: 2017-08-18 | Disposition: A | Discharge status: Home / Self Care | Attending: Emergency Medicine | Admitting: Emergency Medicine

## 2017-08-18 ENCOUNTER — Encounter (HOSPITAL_COMMUNITY): Payer: Self-pay | Admitting: Emergency Medicine

## 2017-08-18 DIAGNOSIS — M2550 Pain in unspecified joint: Secondary | ICD-10-CM | POA: Diagnosis not present

## 2017-08-18 DIAGNOSIS — Z7401 Bed confinement status: Secondary | ICD-10-CM | POA: Diagnosis not present

## 2017-08-18 DIAGNOSIS — Y92128 Other place in nursing home as the place of occurrence of the external cause: Secondary | ICD-10-CM | POA: Diagnosis not present

## 2017-08-18 DIAGNOSIS — R251 Tremor, unspecified: Secondary | ICD-10-CM | POA: Insufficient documentation

## 2017-08-18 DIAGNOSIS — Y999 Unspecified external cause status: Secondary | ICD-10-CM | POA: Insufficient documentation

## 2017-08-18 DIAGNOSIS — S80212A Abrasion, left knee, initial encounter: Secondary | ICD-10-CM | POA: Diagnosis not present

## 2017-08-18 DIAGNOSIS — F0390 Unspecified dementia without behavioral disturbance: Secondary | ICD-10-CM | POA: Insufficient documentation

## 2017-08-18 DIAGNOSIS — S50312A Abrasion of left elbow, initial encounter: Secondary | ICD-10-CM | POA: Diagnosis not present

## 2017-08-18 DIAGNOSIS — Y939 Activity, unspecified: Secondary | ICD-10-CM | POA: Insufficient documentation

## 2017-08-18 DIAGNOSIS — W19XXXA Unspecified fall, initial encounter: Secondary | ICD-10-CM | POA: Insufficient documentation

## 2017-08-18 DIAGNOSIS — Z79899 Other long term (current) drug therapy: Secondary | ICD-10-CM | POA: Diagnosis not present

## 2017-08-18 DIAGNOSIS — S0003XA Contusion of scalp, initial encounter: Secondary | ICD-10-CM | POA: Insufficient documentation

## 2017-08-18 DIAGNOSIS — I10 Essential (primary) hypertension: Secondary | ICD-10-CM | POA: Diagnosis not present

## 2017-08-18 DIAGNOSIS — S0990XA Unspecified injury of head, initial encounter: Secondary | ICD-10-CM | POA: Diagnosis present

## 2017-08-18 NOTE — ED Triage Notes (Addendum)
Patient arrived from [place] at [facility] via EMS. Hx of dementia, tremors at baseline, A&O X1 at baseline. She is under the care of Hospice.  Patient was found on the floor at 1045 per [facility] staff, they called hospice and was instructed to send patient to [facility]  Daughter just arrived to bedside, she states, "she does not take anything anymore, no medicines, just 1 Lexapro and 1 xanax at night."

## 2017-08-18 NOTE — ED Notes (Signed)
[facility] called for transport.  

## 2017-08-18 NOTE — ED Notes (Signed)
Low Sodium Heart Healthy Diet was ordered for Dinner.

## 2017-08-18 NOTE — ED Notes (Signed)
ED Provider at bedside. 

## 2017-08-18 NOTE — ED Triage Notes (Signed)
Family in room with PT waiting for [facility].

## 2017-08-18 NOTE — ED Provider Notes (Signed)
Julie Oliver Provider Note   CSN: [id number] Arrival date & time: 08/18/17  1126   LEVEL 5 CAVEAT - DEMENTIA  History   Chief Complaint Chief Complaint  Patient presents with  . Fall    HPI Nikitta Sobiech is a 84 y.o. female.  HPI  84 year old female presents after a fall.  She was found in her living facility on the ground.  Family presumes that she might of fallen because she often does not use her walker unless reminded.  They found her on the ground.  The patient is alert and oriented to her self which is typical for her.  She has a chronic tremor.  She was recently admitted to the hospital and put in hospice care.  However she has been taken out of that as she is still alive.  Family essentially wants supportive care only.  She has otherwise been acting like her typical self with no fevers or vomiting or other new symptoms recently.  Currently seems like at her mental baseline.   Past Medical History:  Diagnosis Date  . Gastro - esophageal reflux disease   . Hypertension   . Insomnia   . Small bowel obstruction Julie Oliver)     Patient Active Problem List   Diagnosis Date Noted  . Ileus ([place])   . Lower urinary tract infectious disease   . Palliative care encounter   . Comfort measures only status   . AKI (acute kidney injury) ([place])   . Altered mental status 06/29/2017    Past Surgical History:  Procedure Laterality Date  . MOUTH SURGERY       OB History   None      Home Medications    Prior to Admission medications   Medication Sig Start Date End Date Taking? Authorizing Provider  ALPRAZolam Duanne Moron) 0.5 MG tablet Take 0.5 mg by mouth at bedtime. 2200   Yes [provider]  escitalopram (LEXAPRO) 20 MG tablet Take 20 mg by mouth at bedtime. 2200   Yes [provider]  glycopyrrolate (ROBINUL) 1 MG tablet Take 1 tablet (1 mg total) by mouth every 4 (four) hours as needed (excessive secretions). Patient not  taking: Reported on 08/18/2017 07/01/17   Jonetta Osgood, MD  haloperidol (HALDOL) 0.5 MG tablet Take 1 tablet (0.5 mg total) by mouth every 4 (four) hours as needed for agitation (or delirium). Patient not taking: Reported on 08/18/2017 07/01/17   Jonetta Osgood, MD  LORazepam (ATIVAN) 2 MG/ML concentrated solution Take 0.3 mLs (0.6 mg total) by mouth every 6 (six) hours as needed for anxiety or sleep. Patient not taking: Reported on 08/18/2017 07/01/17   Jonetta Osgood, MD  Morphine Sulfate (MORPHINE CONCENTRATE) 10 MG/0.5ML SOLN concentrated solution Place 0.25 mLs (5 mg total) under the tongue every 2 (two) hours as needed for moderate pain (or dyspnea). Patient not taking: Reported on 08/18/2017 07/01/17   Jonetta Osgood, MD    Family History No family history on file.  Social History Social History   Tobacco Use  . Smoking status: Never Smoker  . Smokeless tobacco: Never Used  Substance Use Topics  . Alcohol use: No  . Drug use: No     Allergies   Patient has no known allergies.   Review of Systems Review of Systems  Unable to perform ROS: Dementia     Physical Exam Updated Vital Signs BP (!) 170/80 (BP Location: Right Arm)   Pulse 80  Temp 97.8 F (36.6 C) (Axillary)   Resp 18   SpO2 100%   Physical Exam  Constitutional: She appears well-developed and well-nourished.  HENT:  Head: Normocephalic.    Right Ear: External ear normal.  Left Ear: External ear normal.  Nose: Nose normal.  Eyes: Right eye exhibits discharge (watery). Left eye exhibits discharge (watery).  Pale conjunctivae  Cardiovascular: Normal rate, regular rhythm and normal heart sounds.  Pulmonary/Chest: Effort normal and breath sounds normal.  Abdominal: Soft. She exhibits no distension. There is no tenderness.  Musculoskeletal:       Left shoulder: She exhibits no tenderness.       Left elbow: She exhibits laceration (minimal abrasion). She exhibits normal range of motion. No  tenderness found.       Left hip: She exhibits normal range of motion and no tenderness.       Left knee: She exhibits swelling (mild, lateral). She exhibits normal range of motion. No tenderness found.  Neurological: She is alert. She displays tremor.  Moves all 4 extremities equally. Awake, alert, oriented to self.  Skin: Skin is warm and dry.  Nursing note and vitals reviewed.    ED Treatments / Results  Labs (all labs ordered are listed, but only abnormal results are displayed) Labs Reviewed - No data to display  EKG None  Radiology Ct Head Wo Contrast  Result Date: 08/18/2017 CLINICAL DATA:  Head trauma. History of dementia, tremor, hypertension. On hospice. EXAM: CT HEAD WITHOUT CONTRAST TECHNIQUE: Contiguous axial images were obtained from the base of the skull through the vertex without intravenous contrast. COMPARISON:  CT HEAD Jun 29, 2017 FINDINGS: BRAIN: No intraparenchymal hemorrhage, mass effect nor midline shift. Moderate to severe parenchymal brain volume loss. Old bilateral basal ganglia lacunar infarcts. Patchy supratentorial white matter hypodensities less than expected for patient's age, though non-specific are most compatible with chronic small vessel ischemic disease. No acute large vascular territory infarcts. No abnormal extra-axial fluid collections. Basal cisterns are patent. VASCULAR: Moderate calcific atherosclerosis of the carotid siphons. SKULL: No skull fracture. Small LEFT frontoparietal scalp hematoma. No subcutaneous gas or radiopaque foreign bodies. SINUSES/ORBITS: Chronic RIGHT frontal sinusitis with mucoperiosteal reaction. Mild paranasal sinus mucosal thickening with small mucosal retention cyst. LEFT middle ear and mastoid effusion.The included ocular globes and orbital contents are non-suspicious. Status post bilateral ocular lens implants. OTHER: None. IMPRESSION: 1. No acute intracranial process. Small LEFT scalp hematoma. No skull fracture. 2. Stable  examination including moderate to severe parenchymal brain volume loss and old basal ganglia infarcts. Electronically Signed   By: Elon Alas M.D.   On: 08/18/2017 14:10   Dg Knee Complete 4 Views Left  Result Date: 08/18/2017 CLINICAL DATA:  [place] and has knee swelling. EXAM: LEFT KNEE - COMPLETE 4+ VIEW COMPARISON:  None in PACs FINDINGS: The bones are subjectively adequately mineralized. The joint spaces are reasonably well-maintained. There is chondrocalcinosis of the menisci. The patellofemoral joint space appears normal. There is no suprapatellar effusion. There is no acute fracture nor dislocation. IMPRESSION: Mild degenerative change of the medial and lateral compartments manifested chiefly by chondrocalcinosis. No acute fracture nor dislocation. Electronically Signed   By: David  Martinique M.D.   On: 08/18/2017 12:33    Procedures Procedures (including critical care time)  Medications Ordered in ED Medications - No data to display   Initial Impression / Assessment and Plan / ED Course  I have reviewed the triage vital signs and the nursing notes.  Pertinent labs & imaging  results that were available during my care of the patient were reviewed by me and considered in my medical decision making (see chart for details).     Patient is in no acute distress.  She has some mild evidence of pressure injury from where she was laying on the floor.  After discussion with family, they do not want any aggressive measures.  They would like to have a CT head to rule out a bleed so they can know if she is to go downhill quickly.  However they do not want any type of intervention.  They declined lab work or urinalysis.  I think this is reasonable as she was recently in hospice and they only want her to be made comfortable.  CT head and x-ray of knee are unremarkable.  Thus she will be discharged back to her facility.  Final Clinical Impressions(s) / ED Diagnoses   Final diagnoses:  Fall, initial  encounter  Hematoma of scalp, initial encounter  Abrasion of left knee, initial encounter    ED Discharge Orders    None       Sherwood Gambler, MD 08/18/17 1552

## 2017-08-18 NOTE — ED Notes (Signed)
Patient transported to X-ray 

## 2017-09-05 DIAGNOSIS — H6123 Impacted cerumen, bilateral: Secondary | ICD-10-CM | POA: Diagnosis not present

## 2017-09-18 DIAGNOSIS — I10 Essential (primary) hypertension: Secondary | ICD-10-CM | POA: Diagnosis not present

## 2017-09-18 DIAGNOSIS — H6123 Impacted cerumen, bilateral: Secondary | ICD-10-CM | POA: Diagnosis not present

## 2017-09-18 DIAGNOSIS — H903 Sensorineural hearing loss, bilateral: Secondary | ICD-10-CM | POA: Diagnosis not present

## 2017-09-18 DIAGNOSIS — R4181 Age-related cognitive decline: Secondary | ICD-10-CM | POA: Diagnosis not present

## 2017-10-12 DIAGNOSIS — I10 Essential (primary) hypertension: Secondary | ICD-10-CM | POA: Diagnosis not present

## 2017-10-12 DIAGNOSIS — R1310 Dysphagia, unspecified: Secondary | ICD-10-CM | POA: Diagnosis not present

## 2017-10-12 DIAGNOSIS — I672 Cerebral atherosclerosis: Secondary | ICD-10-CM | POA: Diagnosis not present

## 2017-10-12 DIAGNOSIS — I679 Cerebrovascular disease, unspecified: Secondary | ICD-10-CM | POA: Diagnosis not present

## 2017-10-12 DIAGNOSIS — F339 Major depressive disorder, recurrent, unspecified: Secondary | ICD-10-CM | POA: Diagnosis not present

## 2017-10-17 DIAGNOSIS — R1310 Dysphagia, unspecified: Secondary | ICD-10-CM | POA: Diagnosis not present

## 2017-10-17 DIAGNOSIS — I672 Cerebral atherosclerosis: Secondary | ICD-10-CM | POA: Diagnosis not present

## 2017-10-17 DIAGNOSIS — I679 Cerebrovascular disease, unspecified: Secondary | ICD-10-CM | POA: Diagnosis not present

## 2017-10-17 DIAGNOSIS — F339 Major depressive disorder, recurrent, unspecified: Secondary | ICD-10-CM | POA: Diagnosis not present

## 2017-10-17 DIAGNOSIS — I10 Essential (primary) hypertension: Secondary | ICD-10-CM | POA: Diagnosis not present

## 2017-10-21 DIAGNOSIS — I672 Cerebral atherosclerosis: Secondary | ICD-10-CM | POA: Diagnosis not present

## 2017-10-21 DIAGNOSIS — R1310 Dysphagia, unspecified: Secondary | ICD-10-CM | POA: Diagnosis not present

## 2017-10-21 DIAGNOSIS — I10 Essential (primary) hypertension: Secondary | ICD-10-CM | POA: Diagnosis not present

## 2017-10-21 DIAGNOSIS — I679 Cerebrovascular disease, unspecified: Secondary | ICD-10-CM | POA: Diagnosis not present

## 2017-10-21 DIAGNOSIS — F339 Major depressive disorder, recurrent, unspecified: Secondary | ICD-10-CM | POA: Diagnosis not present

## 2017-10-24 DIAGNOSIS — R1310 Dysphagia, unspecified: Secondary | ICD-10-CM | POA: Diagnosis not present

## 2017-10-24 DIAGNOSIS — I679 Cerebrovascular disease, unspecified: Secondary | ICD-10-CM | POA: Diagnosis not present

## 2017-10-24 DIAGNOSIS — I10 Essential (primary) hypertension: Secondary | ICD-10-CM | POA: Diagnosis not present

## 2017-10-24 DIAGNOSIS — F339 Major depressive disorder, recurrent, unspecified: Secondary | ICD-10-CM | POA: Diagnosis not present

## 2017-10-24 DIAGNOSIS — I672 Cerebral atherosclerosis: Secondary | ICD-10-CM | POA: Diagnosis not present

## 2017-10-29 DIAGNOSIS — I10 Essential (primary) hypertension: Secondary | ICD-10-CM | POA: Diagnosis not present

## 2017-10-29 DIAGNOSIS — R1310 Dysphagia, unspecified: Secondary | ICD-10-CM | POA: Diagnosis not present

## 2017-10-29 DIAGNOSIS — I679 Cerebrovascular disease, unspecified: Secondary | ICD-10-CM | POA: Diagnosis not present

## 2017-10-29 DIAGNOSIS — F339 Major depressive disorder, recurrent, unspecified: Secondary | ICD-10-CM | POA: Diagnosis not present

## 2017-10-29 DIAGNOSIS — I672 Cerebral atherosclerosis: Secondary | ICD-10-CM | POA: Diagnosis not present

## 2017-10-31 DIAGNOSIS — I679 Cerebrovascular disease, unspecified: Secondary | ICD-10-CM | POA: Diagnosis not present

## 2017-10-31 DIAGNOSIS — I10 Essential (primary) hypertension: Secondary | ICD-10-CM | POA: Diagnosis not present

## 2017-10-31 DIAGNOSIS — R1310 Dysphagia, unspecified: Secondary | ICD-10-CM | POA: Diagnosis not present

## 2017-10-31 DIAGNOSIS — F339 Major depressive disorder, recurrent, unspecified: Secondary | ICD-10-CM | POA: Diagnosis not present

## 2017-10-31 DIAGNOSIS — I672 Cerebral atherosclerosis: Secondary | ICD-10-CM | POA: Diagnosis not present

## 2017-11-07 DIAGNOSIS — F339 Major depressive disorder, recurrent, unspecified: Secondary | ICD-10-CM | POA: Diagnosis not present

## 2017-11-07 DIAGNOSIS — I10 Essential (primary) hypertension: Secondary | ICD-10-CM | POA: Diagnosis not present

## 2017-11-07 DIAGNOSIS — R1310 Dysphagia, unspecified: Secondary | ICD-10-CM | POA: Diagnosis not present

## 2017-11-07 DIAGNOSIS — I679 Cerebrovascular disease, unspecified: Secondary | ICD-10-CM | POA: Diagnosis not present

## 2017-11-07 DIAGNOSIS — I672 Cerebral atherosclerosis: Secondary | ICD-10-CM | POA: Diagnosis not present

## 2017-11-11 DIAGNOSIS — I10 Essential (primary) hypertension: Secondary | ICD-10-CM | POA: Diagnosis not present

## 2017-11-11 DIAGNOSIS — I672 Cerebral atherosclerosis: Secondary | ICD-10-CM | POA: Diagnosis not present

## 2017-11-11 DIAGNOSIS — R1310 Dysphagia, unspecified: Secondary | ICD-10-CM | POA: Diagnosis not present

## 2017-11-11 DIAGNOSIS — I679 Cerebrovascular disease, unspecified: Secondary | ICD-10-CM | POA: Diagnosis not present

## 2017-11-11 DIAGNOSIS — F339 Major depressive disorder, recurrent, unspecified: Secondary | ICD-10-CM | POA: Diagnosis not present

## 2017-11-14 DIAGNOSIS — I672 Cerebral atherosclerosis: Secondary | ICD-10-CM | POA: Diagnosis not present

## 2017-11-14 DIAGNOSIS — I679 Cerebrovascular disease, unspecified: Secondary | ICD-10-CM | POA: Diagnosis not present

## 2017-11-14 DIAGNOSIS — F339 Major depressive disorder, recurrent, unspecified: Secondary | ICD-10-CM | POA: Diagnosis not present

## 2017-11-14 DIAGNOSIS — I10 Essential (primary) hypertension: Secondary | ICD-10-CM | POA: Diagnosis not present

## 2017-11-14 DIAGNOSIS — R1310 Dysphagia, unspecified: Secondary | ICD-10-CM | POA: Diagnosis not present

## 2017-11-17 DIAGNOSIS — I672 Cerebral atherosclerosis: Secondary | ICD-10-CM | POA: Diagnosis not present

## 2017-11-17 DIAGNOSIS — F339 Major depressive disorder, recurrent, unspecified: Secondary | ICD-10-CM | POA: Diagnosis not present

## 2017-11-17 DIAGNOSIS — I679 Cerebrovascular disease, unspecified: Secondary | ICD-10-CM | POA: Diagnosis not present

## 2017-11-17 DIAGNOSIS — R1310 Dysphagia, unspecified: Secondary | ICD-10-CM | POA: Diagnosis not present

## 2017-11-17 DIAGNOSIS — I10 Essential (primary) hypertension: Secondary | ICD-10-CM | POA: Diagnosis not present

## 2017-11-20 DIAGNOSIS — F339 Major depressive disorder, recurrent, unspecified: Secondary | ICD-10-CM | POA: Diagnosis not present

## 2017-11-20 DIAGNOSIS — I10 Essential (primary) hypertension: Secondary | ICD-10-CM | POA: Diagnosis not present

## 2017-11-20 DIAGNOSIS — I672 Cerebral atherosclerosis: Secondary | ICD-10-CM | POA: Diagnosis not present

## 2017-11-20 DIAGNOSIS — I679 Cerebrovascular disease, unspecified: Secondary | ICD-10-CM | POA: Diagnosis not present

## 2017-11-20 DIAGNOSIS — R1310 Dysphagia, unspecified: Secondary | ICD-10-CM | POA: Diagnosis not present

## 2017-11-21 DIAGNOSIS — I672 Cerebral atherosclerosis: Secondary | ICD-10-CM | POA: Diagnosis not present

## 2017-11-21 DIAGNOSIS — I679 Cerebrovascular disease, unspecified: Secondary | ICD-10-CM | POA: Diagnosis not present

## 2017-11-21 DIAGNOSIS — I10 Essential (primary) hypertension: Secondary | ICD-10-CM | POA: Diagnosis not present

## 2017-11-21 DIAGNOSIS — R1310 Dysphagia, unspecified: Secondary | ICD-10-CM | POA: Diagnosis not present

## 2017-11-21 DIAGNOSIS — F339 Major depressive disorder, recurrent, unspecified: Secondary | ICD-10-CM | POA: Diagnosis not present

## 2017-11-26 DIAGNOSIS — I679 Cerebrovascular disease, unspecified: Secondary | ICD-10-CM | POA: Diagnosis not present

## 2017-11-26 DIAGNOSIS — F339 Major depressive disorder, recurrent, unspecified: Secondary | ICD-10-CM | POA: Diagnosis not present

## 2017-11-26 DIAGNOSIS — I10 Essential (primary) hypertension: Secondary | ICD-10-CM | POA: Diagnosis not present

## 2017-11-26 DIAGNOSIS — I672 Cerebral atherosclerosis: Secondary | ICD-10-CM | POA: Diagnosis not present

## 2017-11-26 DIAGNOSIS — R1310 Dysphagia, unspecified: Secondary | ICD-10-CM | POA: Diagnosis not present

## 2017-11-27 DIAGNOSIS — R1310 Dysphagia, unspecified: Secondary | ICD-10-CM | POA: Diagnosis not present

## 2017-11-27 DIAGNOSIS — F339 Major depressive disorder, recurrent, unspecified: Secondary | ICD-10-CM | POA: Diagnosis not present

## 2017-11-27 DIAGNOSIS — I679 Cerebrovascular disease, unspecified: Secondary | ICD-10-CM | POA: Diagnosis not present

## 2017-11-27 DIAGNOSIS — I10 Essential (primary) hypertension: Secondary | ICD-10-CM | POA: Diagnosis not present

## 2017-11-27 DIAGNOSIS — I672 Cerebral atherosclerosis: Secondary | ICD-10-CM | POA: Diagnosis not present

## 2017-12-04 DIAGNOSIS — I679 Cerebrovascular disease, unspecified: Secondary | ICD-10-CM | POA: Diagnosis not present

## 2017-12-04 DIAGNOSIS — F339 Major depressive disorder, recurrent, unspecified: Secondary | ICD-10-CM | POA: Diagnosis not present

## 2017-12-04 DIAGNOSIS — I672 Cerebral atherosclerosis: Secondary | ICD-10-CM | POA: Diagnosis not present

## 2017-12-04 DIAGNOSIS — R1310 Dysphagia, unspecified: Secondary | ICD-10-CM | POA: Diagnosis not present

## 2017-12-04 DIAGNOSIS — I10 Essential (primary) hypertension: Secondary | ICD-10-CM | POA: Diagnosis not present

## 2017-12-11 DIAGNOSIS — F339 Major depressive disorder, recurrent, unspecified: Secondary | ICD-10-CM | POA: Diagnosis not present

## 2017-12-11 DIAGNOSIS — I672 Cerebral atherosclerosis: Secondary | ICD-10-CM | POA: Diagnosis not present

## 2017-12-11 DIAGNOSIS — I679 Cerebrovascular disease, unspecified: Secondary | ICD-10-CM | POA: Diagnosis not present

## 2017-12-11 DIAGNOSIS — R1310 Dysphagia, unspecified: Secondary | ICD-10-CM | POA: Diagnosis not present

## 2017-12-11 DIAGNOSIS — I10 Essential (primary) hypertension: Secondary | ICD-10-CM | POA: Diagnosis not present

## 2017-12-12 DIAGNOSIS — I10 Essential (primary) hypertension: Secondary | ICD-10-CM | POA: Diagnosis not present

## 2017-12-12 DIAGNOSIS — I672 Cerebral atherosclerosis: Secondary | ICD-10-CM | POA: Diagnosis not present

## 2017-12-12 DIAGNOSIS — R1310 Dysphagia, unspecified: Secondary | ICD-10-CM | POA: Diagnosis not present

## 2017-12-12 DIAGNOSIS — F339 Major depressive disorder, recurrent, unspecified: Secondary | ICD-10-CM | POA: Diagnosis not present

## 2017-12-12 DIAGNOSIS — I679 Cerebrovascular disease, unspecified: Secondary | ICD-10-CM | POA: Diagnosis not present

## 2017-12-24 ENCOUNTER — Other Ambulatory Visit: Payer: Self-pay

## 2017-12-24 ENCOUNTER — Inpatient Hospital Stay (HOSPITAL_COMMUNITY)
Admission: EM | Admit: 2017-12-24 | Discharge: 2017-12-27 | DRG: 071 | Disposition: A | Discharge status: Nursing Home (Includes ICF) | Attending: Family Medicine | Admitting: Family Medicine

## 2017-12-24 DIAGNOSIS — W19XXXA Unspecified fall, initial encounter: Secondary | ICD-10-CM | POA: Diagnosis present

## 2017-12-24 DIAGNOSIS — Z515 Encounter for palliative care: Secondary | ICD-10-CM | POA: Diagnosis present

## 2017-12-24 DIAGNOSIS — F0391 Unspecified dementia with behavioral disturbance: Secondary | ICD-10-CM | POA: Diagnosis present

## 2017-12-24 DIAGNOSIS — F418 Other specified anxiety disorders: Secondary | ICD-10-CM | POA: Diagnosis not present

## 2017-12-24 DIAGNOSIS — I10 Essential (primary) hypertension: Secondary | ICD-10-CM | POA: Diagnosis present

## 2017-12-24 DIAGNOSIS — R296 Repeated falls: Secondary | ICD-10-CM | POA: Diagnosis present

## 2017-12-24 DIAGNOSIS — R531 Weakness: Secondary | ICD-10-CM | POA: Insufficient documentation

## 2017-12-24 DIAGNOSIS — Z8673 Personal history of transient ischemic attack (TIA), and cerebral infarction without residual deficits: Secondary | ICD-10-CM

## 2017-12-24 DIAGNOSIS — I639 Cerebral infarction, unspecified: Secondary | ICD-10-CM | POA: Diagnosis present

## 2017-12-24 DIAGNOSIS — Z79899 Other long term (current) drug therapy: Secondary | ICD-10-CM

## 2017-12-24 DIAGNOSIS — Z66 Do not resuscitate: Secondary | ICD-10-CM | POA: Diagnosis present

## 2017-12-24 DIAGNOSIS — K219 Gastro-esophageal reflux disease without esophagitis: Secondary | ICD-10-CM | POA: Diagnosis present

## 2017-12-24 DIAGNOSIS — R0902 Hypoxemia: Secondary | ICD-10-CM | POA: Diagnosis not present

## 2017-12-24 DIAGNOSIS — G4700 Insomnia, unspecified: Secondary | ICD-10-CM | POA: Diagnosis present

## 2017-12-24 DIAGNOSIS — H9190 Unspecified hearing loss, unspecified ear: Secondary | ICD-10-CM | POA: Diagnosis present

## 2017-12-24 DIAGNOSIS — I672 Cerebral atherosclerosis: Principal | ICD-10-CM | POA: Diagnosis present

## 2017-12-24 DIAGNOSIS — F1320 Sedative, hypnotic or anxiolytic dependence, uncomplicated: Secondary | ICD-10-CM | POA: Diagnosis present

## 2017-12-24 DIAGNOSIS — F0390 Unspecified dementia without behavioral disturbance: Secondary | ICD-10-CM | POA: Diagnosis present

## 2017-12-24 DIAGNOSIS — I4891 Unspecified atrial fibrillation: Secondary | ICD-10-CM | POA: Diagnosis not present

## 2017-12-24 LAB — COMPREHENSIVE METABOLIC PANEL
ALBUMIN: 2.9 g/dL — AB (ref 3.5–5.0)
ALT: 10 U/L (ref 0–44)
AST: 19 U/L (ref 15–41)
Alkaline Phosphatase: 61 U/L (ref 38–126)
Anion gap: 6 (ref 5–15)
BILIRUBIN TOTAL: 0.5 mg/dL (ref 0.3–1.2)
BUN: 20 mg/dL (ref 8–23)
CHLORIDE: 109 mmol/L (ref 98–111)
CO2: 25 mmol/L (ref 22–32)
Calcium: 8.7 mg/dL — ABNORMAL LOW (ref 8.9–10.3)
Creatinine, Ser: 1 mg/dL (ref 0.44–1.00)
GFR calc Af Amer: 54 mL/min — ABNORMAL LOW (ref >60)
GFR calc non Af Amer: 46 mL/min — ABNORMAL LOW (ref >60)
GLUCOSE: 113 mg/dL — AB (ref 70–99)
POTASSIUM: 3.4 mmol/L — AB (ref 3.5–5.1)
Sodium: 140 mmol/L (ref 135–145)
Total Protein: 5.4 g/dL — ABNORMAL LOW (ref 6.5–8.1)

## 2017-12-24 LAB — CBC
HEMATOCRIT: 34.8 % — AB (ref 36.0–46.0)
Hemoglobin: 11.1 g/dL — ABNORMAL LOW (ref 12.0–15.0)
MCH: 28.3 pg (ref 26.0–34.0)
MCHC: 31.9 g/dL (ref 30.0–36.0)
MCV: 88.8 fL (ref 80.0–100.0)
NRBC: 0 % (ref 0.0–0.2)
PLATELETS: 212 10*3/uL (ref 150–400)
RBC: 3.92 MIL/uL (ref 3.87–5.11)
RDW: 13.3 % (ref 11.5–15.5)
WBC: 8.4 10*3/uL (ref 4.0–10.5)

## 2017-12-24 MED ORDER — HYDRALAZINE HCL 20 MG/ML IJ SOLN: 5.0000 mg | INTRAMUSCULAR | Status: DC | PRN

## 2017-12-24 MED ORDER — ZOLPIDEM TARTRATE 5 MG PO TABS: 5.0000 mg | ORAL_TABLET | Freq: Every evening | ORAL | Status: DC | PRN

## 2017-12-24 MED ORDER — AMLODIPINE BESYLATE 5 MG PO TABS: 5.0000 mg | ORAL_TABLET | Freq: Every day | ORAL | Status: DC

## 2017-12-24 MED ORDER — ONDANSETRON HCL 4 MG PO TABS: 4.0000 mg | ORAL_TABLET | Freq: Four times a day (QID) | ORAL | Status: DC | PRN

## 2017-12-24 MED ORDER — ACETAMINOPHEN 325 MG PO TABS: 650.0000 mg | ORAL_TABLET | Freq: Four times a day (QID) | ORAL | Status: DC | PRN

## 2017-12-24 MED ORDER — ACETAMINOPHEN 650 MG RE SUPP: 650.0000 mg | Freq: Four times a day (QID) | RECTAL | Status: DC | PRN

## 2017-12-24 MED ORDER — ONDANSETRON HCL 4 MG/2ML IJ SOLN: 4.0000 mg | Freq: Four times a day (QID) | INTRAMUSCULAR | Status: DC | PRN

## 2017-12-24 MED ORDER — SENNOSIDES-DOCUSATE SODIUM 8.6-50 MG PO TABS: 1.0000 | ORAL_TABLET | Freq: Every evening | ORAL | Status: DC | PRN

## 2017-12-24 NOTE — H&P (Signed)
History and Physical    Julie Oliver CVE:[id number] DOB: 08-27-1919 DOA: 12/24/2017  Referring MD/NP/PA:   PCP: Lajean Manes, MD   Patient coming from:  The patient is coming from memory care facility.  At baseline, pt is dependent for most of ADL.        Chief Complaint: fall  HPI: Julie Oliver is a 84 y.o. female with medical history significant of hypertension, stroke, dementia, GERD, depression with anxiety, hard of hearing, small bowel obstruction, who presents with fall.  Per her daughter, patient had an unwitnessed fall in memory care facility. Not sure what happened to her. After the fall, patient could not walk or stand up on her own. She also could not sit on the edge of the bed without assistance. Normally she could walk. She does not complain any pain. Her left leg is slightly externally rotated, and mildly shortened in laying position. She does not seem to have any hip pain. She has mild small skin abrasion in left knee.   At baseline, pt could recognize her daughter, and sometimes is oriented to place, but usually not oriented to time. Today, she seems to be more confused, not oriented x3. She could not recognize her daughter. She could move both arms and right leg, but not left leg. No active respiratory distress, cough, nausea, vomiting or diarrhea noted. No facial droop or slurred speech.   ED Course: pt was found to have  WBC 8.4, potassium 3.4, creatinine 1.0, BUN 20, elevated blood pressure 2013/86, no tachycardia, has Anemia, AND saturation 94% on room air, temperature normal. Patient is placed on MedSurg bed for observation.  Review of Systems: Could not reviewed due to dementia and altered mental status   Allergy: No Known Allergies  Past Medical History:  Diagnosis Date  . Gastro - esophageal reflux disease   . Hypertension   . Insomnia   . Small bowel obstruction [facility]     Past Surgical History:  Procedure Laterality Date  . MOUTH SURGERY       Social History:  reports that she has never smoked. She has never used smokeless tobacco. She reports that she does not drink alcohol or use drugs.  Family History: Could not be reviewed due to dementia and altered mental status.  Prior to Admission medications   Medication Sig Start Date End Date Taking? Authorizing Provider  ALPRAZolam Duanne Moron) 0.5 MG tablet Take 0.5 mg by mouth at bedtime. 2200    [provider]  escitalopram (LEXAPRO) 20 MG tablet Take 20 mg by mouth at bedtime. 2200    [provider]  glycopyrrolate (ROBINUL) 1 MG tablet Take 1 tablet (1 mg total) by mouth every 4 (four) hours as needed (excessive secretions). Patient not taking: Reported on 08/18/2017 07/01/17   Jonetta Osgood, MD  haloperidol (HALDOL) 0.5 MG tablet Take 1 tablet (0.5 mg total) by mouth every 4 (four) hours as needed for agitation (or delirium). Patient not taking: Reported on 08/18/2017 07/01/17   Jonetta Osgood, MD  LORazepam (ATIVAN) 2 MG/ML concentrated solution Take 0.3 mLs (0.6 mg total) by mouth every 6 (six) hours as needed for anxiety or sleep. Patient not taking: Reported on 08/18/2017 07/01/17   Jonetta Osgood, MD  Morphine Sulfate (MORPHINE CONCENTRATE) 10 MG/0.5ML SOLN concentrated solution Place 0.25 mLs (5 mg total) under the tongue every 2 (two) hours as needed for moderate pain (or dyspnea). Patient not taking: Reported on 08/18/2017 07/01/17   Jonetta Osgood, MD  Physical Exam: Vitals:   12/24/17 2048 12/24/17 2100 12/24/17 2115 12/25/17 0011  BP: (!) 213/86 (!) 204/93 (!) 201/83 (!) 209/86  Pulse: 77 83  83  Resp: 18 12 (!) 26 (!) 24  Temp: 98.4 F (36.9 C)     TempSrc: Oral     SpO2: 94% 95%  95%   General: Not in acute distress HEENT:       Eyes: PERRL, EOMI, no scleral icterus.       ENT: No discharge from the ears and nose, no pharynx injection, no tonsillar enlargement.        Neck: No JVD, no bruit, no mass felt. Heme: No neck lymph node  enlargement. Cardiac: S1/S2, RRR, No murmurs, No gallops or rubs. Respiratory:  No rales, wheezing, rhonchi or rubs. GI: Soft, nondistended, nontender, no organomegaly, BS present. GU: No hematuria Ext: No pitting leg edema bilaterally. 2+DP/PT pulse bilaterally. Musculoskeletal: left leg is slightly externally rotated and mildly shortened. Skin: No rashes.  Neuro: confused, not oriented X3, cranial nerves II-XII grossly intact. Not moving left leg. Move both arms and right leg.  Psych: Patient is not psychotic, no suicidal or hemocidal ideation.  Labs on Admission: I have personally reviewed following labs and imaging studies  CBC: Recent Labs  Lab 12/24/17 2303  WBC 8.4  HGB 11.1*  HCT 34.8*  MCV 88.8  PLT 009   Basic Metabolic Panel: Recent Labs  Lab 12/24/17 2303  NA 140  K 3.4*  CL 109  CO2 25  GLUCOSE 113*  BUN 20  CREATININE 1.00  CALCIUM 8.7*   GFR: CrCl cannot be calculated (Unknown ideal weight.). Liver Function Tests: Recent Labs  Lab 12/24/17 2303  AST 19  ALT 10  ALKPHOS 61  BILITOT 0.5  PROT 5.4*  ALBUMIN 2.9*   No results for input(s): LIPASE, AMYLASE in the last 168 hours. No results for input(s): AMMONIA in the last 168 hours. Coagulation Profile: No results for input(s): INR, PROTIME in the last 168 hours. Cardiac Enzymes: No results for input(s): CKTOTAL, CKMB, CKMBINDEX, TROPONINI in the last 168 hours. BNP (last 3 results) No results for input(s): PROBNP in the last 8760 hours. HbA1C: No results for input(s): HGBA1C in the last 72 hours. CBG: No results for input(s): GLUCAP in the last 168 hours. Lipid Profile: No results for input(s): CHOL, HDL, LDLCALC, TRIG, CHOLHDL, LDLDIRECT in the last 72 hours. Thyroid Function Tests: No results for input(s): TSH, T4TOTAL, FREET4, T3FREE, THYROIDAB in the last 72 hours. Anemia Panel: No results for input(s): VITAMINB12, FOLATE, FERRITIN, TIBC, IRON, RETICCTPCT in the last 72 hours. Urine  analysis:    Component Value Date/Time   COLORURINE STRAW (A) 06/28/2017 2355   APPEARANCEUR CLEAR 06/28/2017 2355   LABSPEC 1.008 06/28/2017 2355   PHURINE 6.0 06/28/2017 2355   GLUCOSEU NEGATIVE 06/28/2017 2355   HGBUR SMALL (A) 06/28/2017 2355   BILIRUBINUR NEGATIVE 06/28/2017 2355   BILIRUBINUR neg 08/18/2011 1750   KETONESUR NEGATIVE 06/28/2017 2355   PROTEINUR NEGATIVE 06/28/2017 2355   UROBILINOGEN 0.2 02/12/2014 1622   NITRITE NEGATIVE 06/28/2017 2355   LEUKOCYTESUR SMALL (A) 06/28/2017 2355   Sepsis Labs: @LABRCNTIP (procalcitonin:4,lacticidven:4) )No results found for this or any previous visit (from the past 240 hour(s)).   Radiological Exams on Admission: No results found.   EKG:  Not done in ED.   Assessment/Plan Principal Problem:   Fall Active Problems:   Hypertension   Depression with anxiety   Dementia ([place])   Stroke ([place])  Fall: Patient had a witnessed the fall in the facility.  Her left leg is externally rotated and shortened, suspect hip fracture.  As discussed below under goal of care, family does not want any invasive treatment.  Patient is toward to comfort care.  Family refused diagnostic images. Will focus on pain control.  -Placed on MedSurg bed for observation - PRN Percocet, or concentrated morphine -PRN Ativan, glycopyrrolate -Consult to palliative care  Hypertension: -Will not treat elevated blood pressure per power of attorney  Depression with anxiety: -Continue Lexapro and Xanax  Dementia ([place]): no agitation currently. -Observe closely  Hx of CVA with significant delirium/hallucination since December 2018:  -Observe now.  Goal of care: I spent lengthy time to have discussed goal of care with her 2 daughters who are POA (one at the bedside and another one on the phone) in the presence of her son-in law. Patient is DNR. Per her daughter, pt had been on hospice and comfort care previously. Pt is not under hospice care currently.  They want to pursue hospice care and is toward to comfort care from now. They would not be interested in significant surgeries or interventions if in fact pt dose have an injury or bone fracture. They refused diagnostic image, including CT of head and neck, X-ray of left hip. They also do not want to treat any possible infection with antibiotics, such as UTI. UA test is cancelled. Treatment will be focused on comfort measurement from now. Per family, no IVF, no antibiotics, no blood pressure medications, and no more lab draw.    DVT ppx: none (toward comfort care) Code Status: DNR (pt has yellow paper document of DNR which is confirmed by her daughter who is POA) Family Communication:   Yes, patient's daughter and son-in law at bed side Disposition Plan:  To be determined Consults called:  none Admission status: medical floor/obs       Date of Service 12/25/2017    Ivor Costa [facility] Pager [phone]  If 7PM-7AM, please contact night-coverage [email or web address] Password [facility] 12/25/2017, 6:32 AM

## 2017-12-24 NOTE — ED Notes (Signed)
Patient does not take anticoagulants. Patient is also profoundly hard of hearing.

## 2017-12-24 NOTE — ED Provider Notes (Signed)
[facility] Emergency Department Provider Note MRN:  [id number]  Arrival date & time: 12/24/17     Chief Complaint   Fall   History of Present Illness   Julie Oliver is a 84 y.o. year-old female with a history of dementia, hypertension presenting to the ED with chief complaint of fall.  Unwitnessed fall today at her care facility.  Was found on the floor by her providers.  Patient explains to family that she was trying to get her shoes and socks on when she fell.  I was unable to obtain an accurate HPI, PMH, or ROS due to the patient's dementia.  Review of Systems  Positive for unwitnessed fall.  Patient's Health History    Past Medical History:  Diagnosis Date  . Gastro - esophageal reflux disease   . Hypertension   . Insomnia   . Small bowel obstruction [facility])     Past Surgical History:  Procedure Laterality Date  . MOUTH SURGERY      No family history on file.  Social History   Socioeconomic History  . Marital status: Widowed    Spouse name: Not on file  . Number of children: Not on file  . Years of education: Not on file  . Highest education level: Not on file  Occupational History  . Not on file  Social Needs  . Financial resource strain: Not on file  . Food insecurity:    Worry: Not on file    Inability: Not on file  . Transportation needs:    Medical: Not on file    Non-medical: Not on file  Tobacco Use  . Smoking status: Never Smoker  . Smokeless tobacco: Never Used  Substance and Sexual Activity  . Alcohol use: No  . Drug use: No  . Sexual activity: Never  Lifestyle  . Physical activity:    Days per week: Not on file    Minutes per session: Not on file  . Stress: Not on file  Relationships  . Social connections:    Talks on phone: Not on file    Gets together: Not on file    Attends religious service: Not on file    Active member of club or organization: Not on file    Attends meetings of clubs or organizations: Not on  file    Relationship status: Not on file  . Intimate partner violence:    Fear of current or ex partner: Not on file    Emotionally abused: Not on file    Physically abused: Not on file    Forced sexual activity: Not on file  Other Topics Concern  . Not on file  Social History Narrative  . Not on file     Physical Exam  Vital Signs and Nursing Notes reviewed Vitals:   12/24/17 2100 12/24/17 2115  BP: (!) 204/93 (!) 201/83  Pulse: 83   Resp: 12 (!) 26  Temp:    SpO2: 95%     CONSTITUTIONAL: Well-appearing, frail, NAD NEURO:  Alert and oriented x 3, no focal deficits EYES:  eyes equal and reactive ENT/NECK:  no LAD, no JVD CARDIO: Regular rate, well-perfused, normal S1 and S2 PULM:  CTAB no wheezing or rhonchi GI/GU:  normal bowel sounds, non-distended, non-tender MSK/SPINE:  No gross deformities, no edema SKIN:  no rash, atraumatic PSYCH:  Appropriate speech and behavior  Diagnostic and Interventional Summary    EKG Interpretation  Date/Time:    Ventricular Rate:  PR Interval:    QRS Duration:   QT Interval:    QTC Calculation:   R Axis:     Text Interpretation:        Labs Reviewed  CBC  COMPREHENSIVE METABOLIC PANEL  URINALYSIS, ROUTINE W REFLEX MICROSCOPIC    No orders to display    Medications  hydrALAZINE (APRESOLINE) injection 5 mg (has no administration in time range)  amLODipine (NORVASC) tablet 5 mg (has no administration in time range)     Procedures Critical Care  ED Course and Medical Decision Making  I have reviewed the triage vital signs and the nursing notes.  Pertinent labs & imaging results that were available during my care of the patient were reviewed by me and considered in my medical decision making (see below for details).  84 year old female here with unwitnessed fall.  Vital signs stable, currently without pain, no complaints.  Thorough trauma evaluation reveals no evidence of traumatic injury, normal range of motion of all  extremities, benign abdomen, no tenderness to the hips, bilateral breath sounds, no tenderness to the neck or spine.  Normal neurological exam.  Patient is DNR and a discussion was had with patient's family.  Patient would not be interested in significant surgeries or interventions if in fact she did have an injury, which appears to be very unlikely given the exam.  Will observe ambulation here in the ED to ensure that she is safe for discharge.  Surprisingly, patient is very much off her baseline according to family.  Is unable to sit on the edge of the bed without assistance, definitely unable to stand or walk on her own.  Moving all extremities, again with no evidence of trauma, no focal neurological deficits.  Considering metabolic disarray versus UTI.  Family explains that she had been on hospice previously, and they are open to this possibility again.  Admitted to hospitalist service for further care and evaluation, possible placement.    Barth Kirks. [place]onia Small, [place] [email or web address]  Final Clinical Impressions(s) / ED Diagnoses     ICD-10-CM   1. Fall, initial encounter W19.XXXA   2. Weakness R53.1     ED Discharge Orders    None         Maudie Flakes, MD 12/24/17 2316

## 2017-12-24 NOTE — ED Triage Notes (Signed)
Pt BIB [facility] for eval of an unwitnessed fall. No obvious trauma to pt. Pt demented at baseline, controlled aflutter rate of85

## 2017-12-24 NOTE — ED Notes (Signed)
Pt cannot sit up in bed on own. Must be lifted x2 to sit up. By must be lifted off bed to stand x2. Pt cannot stand on own. Supported x2 people and walker. Still unsteady. Returned to bed right away.

## 2017-12-24 NOTE — ED Notes (Signed)
Per pt family, pt is a DNR and therefore are questioning placing the IV. Wishes to speak with admitting MD first.

## 2017-12-25 DIAGNOSIS — I639 Cerebral infarction, unspecified: Secondary | ICD-10-CM | POA: Diagnosis not present

## 2017-12-25 DIAGNOSIS — Z8673 Personal history of transient ischemic attack (TIA), and cerebral infarction without residual deficits: Secondary | ICD-10-CM | POA: Diagnosis not present

## 2017-12-25 DIAGNOSIS — F0391 Unspecified dementia with behavioral disturbance: Secondary | ICD-10-CM | POA: Diagnosis present

## 2017-12-25 DIAGNOSIS — R1310 Dysphagia, unspecified: Secondary | ICD-10-CM | POA: Diagnosis not present

## 2017-12-25 DIAGNOSIS — Z7401 Bed confinement status: Secondary | ICD-10-CM | POA: Diagnosis not present

## 2017-12-25 DIAGNOSIS — I679 Cerebrovascular disease, unspecified: Secondary | ICD-10-CM | POA: Diagnosis not present

## 2017-12-25 DIAGNOSIS — G4700 Insomnia, unspecified: Secondary | ICD-10-CM | POA: Diagnosis present

## 2017-12-25 DIAGNOSIS — Z79899 Other long term (current) drug therapy: Secondary | ICD-10-CM | POA: Diagnosis not present

## 2017-12-25 DIAGNOSIS — R4182 Altered mental status, unspecified: Secondary | ICD-10-CM | POA: Diagnosis not present

## 2017-12-25 DIAGNOSIS — M2550 Pain in unspecified joint: Secondary | ICD-10-CM | POA: Diagnosis not present

## 2017-12-25 DIAGNOSIS — Y92009 Unspecified place in unspecified non-institutional (private) residence as the place of occurrence of the external cause: Secondary | ICD-10-CM | POA: Diagnosis not present

## 2017-12-25 DIAGNOSIS — F0280 Dementia in other diseases classified elsewhere without behavioral disturbance: Secondary | ICD-10-CM | POA: Diagnosis not present

## 2017-12-25 DIAGNOSIS — R296 Repeated falls: Secondary | ICD-10-CM | POA: Diagnosis present

## 2017-12-25 DIAGNOSIS — R0902 Hypoxemia: Secondary | ICD-10-CM | POA: Diagnosis not present

## 2017-12-25 DIAGNOSIS — G459 Transient cerebral ischemic attack, unspecified: Secondary | ICD-10-CM | POA: Diagnosis not present

## 2017-12-25 DIAGNOSIS — Z66 Do not resuscitate: Secondary | ICD-10-CM | POA: Diagnosis present

## 2017-12-25 DIAGNOSIS — H9190 Unspecified hearing loss, unspecified ear: Secondary | ICD-10-CM | POA: Diagnosis present

## 2017-12-25 DIAGNOSIS — Z515 Encounter for palliative care: Secondary | ICD-10-CM | POA: Diagnosis not present

## 2017-12-25 DIAGNOSIS — F0151 Vascular dementia with behavioral disturbance: Secondary | ICD-10-CM | POA: Diagnosis not present

## 2017-12-25 DIAGNOSIS — I672 Cerebral atherosclerosis: Secondary | ICD-10-CM | POA: Diagnosis not present

## 2017-12-25 DIAGNOSIS — K219 Gastro-esophageal reflux disease without esophagitis: Secondary | ICD-10-CM | POA: Diagnosis present

## 2017-12-25 DIAGNOSIS — I10 Essential (primary) hypertension: Secondary | ICD-10-CM | POA: Diagnosis not present

## 2017-12-25 DIAGNOSIS — F339 Major depressive disorder, recurrent, unspecified: Secondary | ICD-10-CM | POA: Diagnosis not present

## 2017-12-25 DIAGNOSIS — F418 Other specified anxiety disorders: Secondary | ICD-10-CM | POA: Diagnosis not present

## 2017-12-25 DIAGNOSIS — W19XXXD Unspecified fall, subsequent encounter: Secondary | ICD-10-CM

## 2017-12-25 DIAGNOSIS — W19XXXA Unspecified fall, initial encounter: Secondary | ICD-10-CM | POA: Diagnosis not present

## 2017-12-25 DIAGNOSIS — F1320 Sedative, hypnotic or anxiolytic dependence, uncomplicated: Secondary | ICD-10-CM | POA: Diagnosis present

## 2017-12-25 DIAGNOSIS — R531 Weakness: Secondary | ICD-10-CM | POA: Diagnosis present

## 2017-12-25 MED ORDER — MORPHINE SULFATE (CONCENTRATE) 10 MG/0.5ML PO SOLN: 5.0000 mg | ORAL | Status: DC | PRN

## 2017-12-25 MED ORDER — HALOPERIDOL 0.5 MG PO TABS: 0.5000 mg | ORAL_TABLET | ORAL | 0 refills | Status: DC | PRN

## 2017-12-25 MED ORDER — LORAZEPAM 2 MG/ML PO CONC: 0.5000 mg | Freq: Four times a day (QID) | ORAL | 0 refills | Status: DC | PRN

## 2017-12-25 MED ORDER — OXYCODONE-ACETAMINOPHEN 5-325 MG PO TABS: 1.0000 | ORAL_TABLET | ORAL | 0 refills | Status: DC | PRN

## 2017-12-25 MED ORDER — MORPHINE SULFATE (CONCENTRATE) 10 MG/0.5ML PO SOLN: 2.5000 mg | ORAL | Status: DC | PRN

## 2017-12-25 MED ORDER — POLYETHYLENE GLYCOL 3350 17 G PO PACK: 17.0000 g | PACK | Freq: Every day | ORAL | 0 refills | Status: DC | PRN

## 2017-12-25 MED ORDER — GLYCOPYRROLATE 1 MG PO TABS: 1.0000 mg | ORAL_TABLET | ORAL | Status: DC | PRN

## 2017-12-25 MED ORDER — HALOPERIDOL 0.5 MG PO TABS
0.5000 mg | ORAL_TABLET | ORAL | Status: DC | PRN
  Filled 2017-12-25: qty 1

## 2017-12-25 MED ORDER — ALPRAZOLAM 0.5 MG PO TABS
0.5000 mg | ORAL_TABLET | Freq: Every day | ORAL | Status: DC
  Administered 2017-12-25 – 2017-12-26 (×2): 0.5 mg via ORAL
  Filled 2017-12-25 (×2): qty 1

## 2017-12-25 MED ORDER — SENNOSIDES-DOCUSATE SODIUM 8.6-50 MG PO TABS: 1.0000 | ORAL_TABLET | Freq: Every evening | ORAL | 0 refills | Status: DC | PRN

## 2017-12-25 MED ORDER — LORAZEPAM 0.5 MG PO TABS: 0.5000 mg | ORAL_TABLET | Freq: Four times a day (QID) | ORAL | Status: DC | PRN

## 2017-12-25 MED ORDER — ALPRAZOLAM 0.5 MG PO TABS: 0.5000 mg | ORAL_TABLET | Freq: Every day | ORAL | 0 refills | Status: AC

## 2017-12-25 MED ORDER — ESCITALOPRAM OXALATE 10 MG PO TABS
20.0000 mg | ORAL_TABLET | Freq: Every day | ORAL | Status: DC
  Administered 2017-12-25 – 2017-12-26 (×2): 20 mg via ORAL
  Filled 2017-12-25 (×2): qty 2

## 2017-12-25 MED ORDER — MORPHINE SULFATE (CONCENTRATE) 10 MG/0.5ML PO SOLN: 5.0000 mg | ORAL | 0 refills | Status: DC | PRN

## 2017-12-25 MED ORDER — OXYCODONE-ACETAMINOPHEN 5-325 MG PO TABS: 1.0000 | ORAL_TABLET | ORAL | Status: DC | PRN

## 2017-12-25 NOTE — Progress Notes (Signed)
Report has been received from ED and patient is on the unit. She came in from [facility] due to a fall. Patient is A&Ox0. Patient has dementia and is not able to speak clearly. From report, nurse stated the Daughter said that her mother is not responding as normal from before she had the fall. Was not able to complete  Admission assessment due to memory impairment & no family at bedside. Patient is a DNR and daughter request comfort care for her mother. A palliative consult has been put in.

## 2017-12-25 NOTE — Progress Notes (Signed)
[facility] GIP RN visit.  This is a related and covered GIP admission of 07/01/2017 with [facility] diagnosis of Cerebral Atherosclerosis per Dr. Clifton James Monguilod. Patient has an [place] DNR. Patient fell in bathroom of LTC facility and the facility activated EMS to bring patient to hospital for evaluation and she was admitted for suspected hip fracture.   Day 1 of [facility] GIP.  Visited with patient at bedside, family is present. Patient does not appear in distress and her family states she is not in pain and has denied pain. Patient is unable to stand and bear weight. Prior to this fall, the patient ambulated independently with no DME.   Family has expressed interest in transferring to [facility] if patient cannot return to [facility].   Per MD notes:  She was noted to have slightly externally rotated left lower extremity and pain upon movement of the leg.  Daughters opted for comfort care and hospice approach.  No further radiologic imaging at this time.  Fall with abnormal appearing left lower extremity, externally rotated -Suspect left lower extremity hip fracture.  No further radiologic imaging per the daughters who are the power of attorney.  Would like to approach her with with comfort care and hospice.  Their services has been notified.  Appreciate palliative care input. -Pain control, supportive care  Goals of Care: comfort care only with no surgical intervention.   Communication with [facility]: spoke with family at beside.   Communication with [facility]: Team notified of admission.   Discharge planning: Goal is comfort care only. [facility] is reviewing to determine if patient is [facility] appropriate or if she is able to return to [facility].   [facility] will continue to follow and anticipate discharge needs.  Please call with any Hospice related questions or concerns.  Please use [facility] for ambulance transportation when patient is discharge.   Thank  you  Farrel Gordon, RN, [facility] Liaison (listed on [place])  [phone]

## 2017-12-25 NOTE — ED Notes (Signed)
Attempting to contact Blaine Hamper MD for clarification of bed orders.

## 2017-12-25 NOTE — Progress Notes (Signed)
PROGRESS NOTE    Julie Oliver  YPP:[id number] DOB: 05-05-1919 DOA: 12/24/2017 PCP: Lajean Manes, MD   Brief Narrative:  84 year old felt past medical history of essential hypertension, stroke, dementia, GERD, depression, anxiety came to the hospital after sustaining a fall.  Patient was previously in hospice several months ago eventually discharged from there as she had been doing well.  Daughters are noted overall decline in the last year.  She was noted to have slightly externally rotated left lower extremity and pain upon movement of the leg.  Daughters opted for comfort care and hospice approach.  No further radiologic imaging at this time.   Assessment & Plan:   Principal Problem:   Fall Active Problems:   Hypertension   Depression with anxiety   Dementia ([place])   Stroke [facility])  Fall with abnormal appearing left lower extremity, externally rotated -Suspect left lower extremity hip fracture.  No further radiologic imaging per the daughters who are the power of attorney.  Would like to approach her with with comfort care and hospice.  Their services has been notified.  Appreciate palliative care input. -Pain control, supportive care - Oxycodone, Haldol, Xanax, morphine concentrate prescription signed and placed in the chart along with bowel regimen.  Essential hypertension - No treatment per family.  Will monitor  History of depression with anxiety - On Lexapro and Xanax  History of CVA with intermittent delirium - Continue to monitor, provide supportive care.   DVT prophylaxis: None Code Status: DNR/DNI Family Communication: Both daughters at bedside, power of attorney Disposition Plan: Transition to hospice care once evaluated by them  Consultants:   Palliative care  Procedures:   None  Antimicrobials:   None   Subjective: Spoke with both the daughters at bedside-they would like to proceed with comfort care.  Review of Systems Otherwise negative except  as per HPI, including: Unable to obtain this fully as patient is laying in the bed with closed eyes.  Does not want to be woken up.   Objective: Vitals:   12/24/17 2100 12/24/17 2115 12/25/17 0011 12/25/17 0705  BP: (!) 204/93 (!) 201/83 (!) 209/86 (!) 178/88  Pulse: 83  83 77  Resp: 12 (!) 26 (!) 24 18  Temp:    98 F (36.7 C)  TempSrc:    Oral  SpO2: 95%  95% 94%    Intake/Output Summary (Last 24 hours) at 12/25/2017 1417 Last data filed at 12/25/2017 1300 Gross per 24 hour  Intake 120 ml  Output -  Net 120 ml   There were no vitals filed for this visit.  Examination:  Patient grossly appears chronically ill and frail.  She is sleeping and family does not want her to be bothered therefore we will hold off on physical examination.  Family is aware that my examination will not change the outcome and would not want anything to be treated.  Data Reviewed:   CBC: Recent Labs  Lab 12/24/17 2303  WBC 8.4  HGB 11.1*  HCT 34.8*  MCV 88.8  PLT 458   Basic Metabolic Panel: Recent Labs  Lab 12/24/17 2303  NA 140  K 3.4*  CL 109  CO2 25  GLUCOSE 113*  BUN 20  CREATININE 1.00  CALCIUM 8.7*   GFR: CrCl cannot be calculated (Unknown ideal weight.). Liver Function Tests: Recent Labs  Lab 12/24/17 2303  AST 19  ALT 10  ALKPHOS 61  BILITOT 0.5  PROT 5.4*  ALBUMIN 2.9*   No results for input(s):  LIPASE, AMYLASE in the last 168 hours. No results for input(s): AMMONIA in the last 168 hours. Coagulation Profile: No results for input(s): INR, PROTIME in the last 168 hours. Cardiac Enzymes: No results for input(s): CKTOTAL, CKMB, CKMBINDEX, TROPONINI in the last 168 hours. BNP (last 3 results) No results for input(s): PROBNP in the last 8760 hours. HbA1C: No results for input(s): HGBA1C in the last 72 hours. CBG: No results for input(s): GLUCAP in the last 168 hours. Lipid Profile: No results for input(s): CHOL, HDL, LDLCALC, TRIG, CHOLHDL, LDLDIRECT in the  last 72 hours. Thyroid Function Tests: No results for input(s): TSH, T4TOTAL, FREET4, T3FREE, THYROIDAB in the last 72 hours. Anemia Panel: No results for input(s): VITAMINB12, FOLATE, FERRITIN, TIBC, IRON, RETICCTPCT in the last 72 hours. Sepsis Labs: No results for input(s): PROCALCITON, LATICACIDVEN in the last 168 hours.  No results found for this or any previous visit (from the past 240 hour(s)).       Radiology Studies: No results found.      Scheduled Meds: . ALPRAZolam  0.5 mg Oral QHS  . escitalopram  20 mg Oral QHS   Continuous Infusions:   LOS: 0 days   Time spent= 30 mins    Addilyn Satterwhite Arsenio Loader, MD [facility] Pager [phone]   If 7PM-7AM, please contact night-coverage [email or web address] Password [email or web address] 12/25/2017, 2:17 PM

## 2017-12-25 NOTE — ED Notes (Signed)
Attempted to call report; will call me back.

## 2017-12-25 NOTE — ED Notes (Signed)
Patient's daughter states that they want comfort care only for patient. MD Niu aware.

## 2017-12-25 NOTE — Plan of Care (Signed)
  Problem: Health Behavior/Discharge Planning: Goal: Ability to manage health-related needs will improve Outcome: Progressing   Problem: Clinical Measurements: Goal: Will remain free from infection Outcome: Progressing   Problem: Activity: Goal: Risk for activity intolerance will decrease Outcome: Progressing   Problem: Nutrition: Goal: Adequate nutrition will be maintained Outcome: Progressing   Problem: Coping: Goal: Level of anxiety will decrease Outcome: Progressing   Problem: Elimination: Goal: Will not experience complications related to bowel motility Outcome: Progressing Goal: Will not experience complications related to urinary retention Outcome: Progressing   Problem: Pain Managment: Goal: General experience of comfort will improve Outcome: Progressing   Problem: Safety: Goal: Ability to remain free from injury will improve Outcome: Progressing   Problem: Skin Integrity: Goal: Risk for impaired skin integrity will decrease Outcome: Progressing

## 2017-12-25 NOTE — ED Notes (Signed)
Dr Blaine Hamper to change bed for comfort care only

## 2017-12-25 NOTE — Consult Note (Signed)
Consultation Note Date: 12/25/2017   Patient Name: Julie Oliver  DOB: 1919/10/24  MRN: [id number]  Age / Sex: 84 y.o., female  PCP: Lajean Manes, MD Referring Physician: Damita Lack, MD  Reason for Consultation: Hospice Evaluation, Non pain symptom management, Pain control and Psychosocial/spiritual support  HPI/Patient Profile: 84 y.o. female with past medical history of dementia, insomnia, previous CVA, anxiety, HOH who was admitted on 12/24/2017 with 11/13 after a fall.  Her family immediately requested comfort care.  No imaging was done.  The patient normally loves to walk, but was unable to bear weight in the ER.  Per her family her speech was more garbled.   Of note Julie Oliver was hospitalized in May 2019 with a UTI.  At that time her family did not think she would recover and they did not want to prolong her life.  She was discharged to [facility].  She improved and graduated out of [employer].  She currently resides at [facility] and is supported by [facility] [place] and [place].  Clinical Assessment and Goals of Care:  I have reviewed medical records including EPIC notes, labs and imaging, assessed the patient and then spoke on the phone with her daughter Karrie Meres.  I met Julie Oliver and Julie Oliver (the patient's two daughters and HCPOA) last May.  We discussed a brief life review of the patient. She is a holocaust survivor.  Her husband passed away 7 years ago.  She enjoys living at [facility] and enjoys walking.  Her daughters encourage her to use her cane but she does not.  She has fallen multiple times recently.  Julie Oliver comments that it is terrible to see her strong intelligent mother with such severe dementia and that she would not want to live like this.  Julie Oliver reports that her mother is normally able to feed herself but has eaten less recently.  She has lost a  significant amount of weight.  Julie Oliver asked, "so what should we do?"  I committed to contact [facility] and ask for their involvement to determine next steps.  As she is on comfort care, once her pain is controlled and she is stable she is ready for discharge.  I called [facility]ospice Liasion, Julie Oliver and discussed the case with her.  Julie Oliver remembers Julie Oliver as well.  She will contact Julie Oliver's care team and social worker and they will evaluate her.   Primary Decision Maker:  HCPOA daughters Julie Oliver and Julie Oliver    SUMMARY OF RECOMMENDATIONS    Suspect possible left hip fracture and possible CVA.  Patient is on full comfort measures.  Restart all home medications.  Patient is benzodiazepine dependent.  Will increase morphine SL PRN due to likely hip fracture.   Patient is actively enrolled with [facility].  They will evaluate patient and discuss with family.   PMT will be available to the medical team and family as needed.  Code Status/Advance Care Planning:  DNR   Symptom Management:   Restart prior to admission medications  Will increase PRN SL morphine due to increased pain in her left hip.  Additional Recommendations (Limitations, Scope, Preferences):  Full Comfort Care  Palliative Prophylaxis:   Aspiration and Frequent Pain Assessment  Prognosis: needs further assessment.  Potentially days to weeks based on likely acute hip fracture and CVA, advanced age, malnutrition, advanced dementia    Discharge Planning: To Be Determined by family and [facility]      Primary Diagnoses: Present on Admission: . Fall . Hypertension . Depression with anxiety . Dementia ([place]) . Stroke [facility])   I have reviewed the medical record, interviewed the patient and family, and examined the patient. The following aspects are pertinent.  Past Medical History:  Diagnosis Date  . Gastro - esophageal reflux disease   . Hypertension   . Insomnia   . Small bowel obstruction ([facility])    Social History    Socioeconomic History  . Marital status: Widowed    Spouse name: Not on file  . Number of children: Not on file  . Years of education: Not on file  . Highest education level: Not on file  Occupational History  . Not on file  Social Needs  . Financial resource strain: Not on file  . Food insecurity:    Worry: Not on file    Inability: Not on file  . Transportation needs:    Medical: Not on file    Non-medical: Not on file  Tobacco Use  . Smoking status: Never Smoker  . Smokeless tobacco: Never Used  Substance and Sexual Activity  . Alcohol use: No  . Drug use: No  . Sexual activity: Never  Lifestyle  . Physical activity:    Days per week: Not on file    Minutes per session: Not on file  . Stress: Not on file  Relationships  . Social connections:    Talks on phone: Not on file    Gets together: Not on file    Attends religious service: Not on file    Active member of club or organization: Not on file    Attends meetings of clubs or organizations: Not on file    Relationship status: Not on file  Other Topics Concern  . Not on file  Social History Narrative  . Not on file   No family history on file. Scheduled Meds: . ALPRAZolam  0.5 mg Oral QHS  . escitalopram  20 mg Oral QHS   Continuous Infusions: PRN Meds:.acetaminophen **OR** acetaminophen, glycopyrrolate, LORazepam, morphine CONCENTRATE, ondansetron **OR** ondansetron (ZOFRAN) IV, oxyCODONE-acetaminophen, senna-docusate, zolpidem No Known Allergies Review of Systems unable  Physical Exam  Frail elderly female awake, attempts to speak with me but speech is garbled.  Significant tremor, pleasantly demented CV rrr with 3/6 systolic murmur Resp no distress Abdomen thin soft, nt, nd    Vital Signs: BP (!) 178/88 (BP Location: Left Arm)   Pulse 77   Temp 98 F (36.7 C) (Oral)   Resp 18   SpO2 94%  Pain Scale: Faces   Pain Score: 0-No pain   SpO2: SpO2: 94 % O2 Device:SpO2: 94 % O2 Flow Rate: .    IO: Intake/output summary: No intake or output data in the 24 hours ending 12/25/17 0854  LBM: Last BM Date: (Prior to admission (No data) ) Baseline Weight:   Most recent weight:       Palliative Assessment/Data: 20%     Time In: 8:30 Time Out: 9:30 Time Total: 60 min. Greater than 50%  of this  time was spent counseling and coordinating care related to the above assessment and plan.  Signed by: Florentina Jenny, PA-C Palliative Medicine Pager: [phone]  Please contact Palliative Medicine Team phone at [phone] for questions and concerns.  For individual provider: See Shea Evans

## 2017-12-26 ENCOUNTER — Inpatient Hospital Stay (HOSPITAL_COMMUNITY)

## 2017-12-26 NOTE — Evaluation (Signed)
Physical Therapy Evaluation Patient Details Name: Julie Oliver MRN: [id number] DOB: 1919/07/09 Today's Date: 12/26/2017   History of Present Illness  Pt is a 84 y/o female admitted after fall at ALF. Imaging of bilateral hips negative for acute abnormality.  PMH includes HTN, dementia, CVA, depression, and anxiety.   Clinical Impression  Pt admitted secondary to problem above with deficits below. Pt with cognitive deficits at baseline. Presenting with weakness and unsteadiness and required min to mod A for mobility using RW. Per notes and pt family, plan is to return to ALF with hospice services. If ALF unable to proved increased assist, pt will likely require SNF level care. Pt's family requesting PT continue to follow acutely throughout pt's stay and will continue to follow acutely to maximize functional mobility independence and safety.     Follow Up Recommendations Other (comment);Supervision/Assistance - 24 hour(Back to ALF with hospice per notes )    Equipment Recommendations  None recommended by PT    Recommendations for Other Services       Precautions / Restrictions Precautions Precautions: Fall Restrictions Weight Bearing Restrictions: No      Mobility  Bed Mobility Overal bed mobility: Needs Assistance Bed Mobility: Supine to Sit;Sit to Supine     Supine to sit: Max assist Sit to supine: Max assist   General bed mobility comments: Max A for LE assist and trunk assist. Required assist to scoot hips to EOB as well once sitting.   Transfers Overall transfer level: Needs assistance Equipment used: Rolling walker (2 wheeled) Transfers: Sit to/from Stand Sit to Stand: Mod assist         General transfer comment: Mod A for lift assist and steadying. Pt initially with posterior lean, however, was able to correct with increased time and multimodal cues.   Ambulation/Gait Ambulation/Gait assistance: Min assist;Mod assist Gait Distance (Feet): 20 Feet Assistive  device: Rolling walker (2 wheeled) Gait Pattern/deviations: Step-through pattern;Decreased stride length;Trunk flexed Gait velocity: Decreased    General Gait Details: Pt very hard of hearing, so did not correct posture with cues. Min to mod A for steadying assist to ambulate to bathroom and back. Pt very slow during gait as well.   Stairs            Wheelchair Mobility    Modified Rankin (Stroke Patients Only)       Balance Overall balance assessment: Needs assistance Sitting-balance support: Bilateral upper extremity supported;Feet supported Sitting balance-Leahy Scale: Poor Sitting balance - Comments: Reliant on BUE support for sitting balance  Postural control: Posterior lean Standing balance support: Bilateral upper extremity supported;During functional activity Standing balance-Leahy Scale: Poor Standing balance comment: Heavy reliance on UE support                              Pertinent Vitals/Pain Pain Assessment: No/denies pain    Home Living Family/patient expects to be discharged to:: Assisted living                 Additional Comments: From [facility]    Prior Function Level of Independence: Independent with assistive device(s)         Comments: Pt's family reports pt was able to ambulate with RW without assist.      Hand Dominance        Extremity/Trunk Assessment   Upper Extremity Assessment Upper Extremity Assessment: Generalized weakness    Lower Extremity Assessment Lower Extremity Assessment: Generalized weakness  Cervical / Trunk Assessment Cervical / Trunk Assessment: Kyphotic  Communication   Communication: HOH  Cognition Arousal/Alertness: Awake/alert Behavior During Therapy: WFL for tasks assessed/performed Overall Cognitive Status: History of cognitive impairments - at baseline                                 General Comments: Pt with history of dementia      General Comments General  comments (skin integrity, edema, etc.): Pt's daughter and son in law present throughout session. Reports plan is to go back to [facility] at d/c. Per notes, pt to go back to [facility] with hospice services.     Exercises     Assessment/Plan    PT Assessment Patient needs continued PT services  PT Problem List Decreased strength;Decreased balance;Decreased cognition;Decreased knowledge of use of DME;Decreased safety awareness;Decreased knowledge of precautions;Decreased mobility       PT Treatment Interventions DME instruction;Gait training;Functional mobility training;Therapeutic activities;Therapeutic exercise;Balance training;Cognitive remediation;Patient/family education    PT Goals (Current goals can be found in the Care Plan section)  Acute Rehab PT Goals Patient Stated Goal: for pt to return to ALF per family  PT Goal Formulation: With family Time For Goal Achievement: 01/09/18 Potential to Achieve Goals: Fair    Frequency Min 3X/week   Barriers to discharge        Co-evaluation               AM-PAC PT "6 Clicks" Daily Activity  Outcome Measure Difficulty turning over in bed (including adjusting bedclothes, sheets and blankets)?: Unable Difficulty moving from lying on back to sitting on the side of the bed? : Unable Difficulty sitting down on and standing up from a chair with arms (e.g., wheelchair, bedside commode, etc,.)?: Unable Help needed moving to and from a bed to chair (including a wheelchair)?: A Lot Help needed walking in hospital room?: A Lot Help needed climbing 3-5 steps with a railing? : Total 6 Click Score: 8    End of Session Equipment Utilized During Treatment: Gait belt Activity Tolerance: Patient tolerated treatment well Patient left: in bed;with call bell/phone within reach;with bed alarm set;with family/visitor present Nurse Communication: Mobility status PT Visit Diagnosis: Unsteadiness on feet (R26.81);Muscle weakness (generalized)  (M62.81);History of falling (Z91.81)    Time: 3220-2542 PT Time Calculation (min) (ACUTE ONLY): 28 min   Charges:   PT Evaluation $PT Eval Moderate Complexity: 1 Mod PT Treatments $Therapeutic Activity: 8-22 mins        Julie Oliver, PT, DPT  Acute Rehabilitation Services  Pager: [phone] Office: [phone]   Rudean Hitt 12/26/2017, 5:43 PM

## 2017-12-26 NOTE — Progress Notes (Signed)
[facility] RN Visit   This is a related and covered GIP admission of 07/01/2017 with [facility] diagnosis of Cerebral Atherosclerosis per Dr. Alferd Patee. Patient has an [place] DNR. Patient fell in bathroom of LTC facility and the facility activated EMS to bring patient to hospital for evaluation and she was admitted for suspected hip fracture.  Per MD notes:  She was noted to have slightly externally rotated left lower extremity and pain upon movement of the leg. Daughters opted for comfort care and hospice approach. No further radiologic imaging at this time.  Day 2 of [facility] GIP.  Visited patient at bedside, she remained asleep and appeared to be comfortable.  Per previous request of family, did not wake her.  Spoke with RN, she advised she has been sleeping mostly and unable to get OOB.  Spoke with [name] (dtr) via phone, updated her and answered questions.  Spoke with son-in-law at bedside and updated with plan as well.  Pain is managed and pt is not requiring anything for pain.  Spoke with Charity at [facility], she advised they are able to take Ms. Wragg back if she will be mostly in bed.  This is an ALF and they can assist, but cannot provide multiple OOB to [facility] etc events.    Goals of Care: comfort care only, return to [facility]  Communication with [facility]: spoke with family at beside and via phone  Communication with [facility]: team updated.   Discharge planning: Goal is comfort care only.  Await MD decision on discharge.  Return to [facility] for EOL care.  [facility] will continue to follow and anticipate discharge needs.  Please call with any hospice related questions or concerns.   Transfer summary and medication list placed on chart.  Please use [facility] for ambulance transportation when patient is discharge.   Thank you,  Venia Carbon BSN, [facility] Liaison (listed on [place])  [phone]

## 2017-12-26 NOTE — Progress Notes (Signed)
PROGRESS NOTE    Julie Oliver  WUJ:[id number] DOB: November 06, 1919 DOA: 12/24/2017 PCP: Lajean Manes, MD   Brief Narrative: 84 year old female with history of hypertension, stroke, dementia, very hard of hearing, GERD, depression, anxiety, who was previously in the hospice care several months ago but eventually graduated as she was ambulatory and eating well, currently in memory care unit where she had a fall and had difficulty bearing the weight.  She was brought to the ER.  No further imaging no work-up and wanted palliative care/hospice care. Family eventually requested imaging work-up which was obtained that did not show any acute pelvic or femur/hip fracture, she is moving all her extremities, not in any pain.  Patient was seen by palliative care.  Assessment & Plan:   Fall at the memory care unit: Is moving her extremities, denies any pain.  Obtain x-ray of the pelvis and femur per daughter request, no acute fracture noted.  PT eval requested.  Accelerated Hypertension: Family refused to treat. I explained adverse events related to uncontrolled hypertension including but not limited to stroke/MI .  Patient's daughter states her blood pressure is high in the hospital however normal at the memory care unit.  They again requested not to be treated.    Depression with anxiety:stable  Dementia/hard of hearing/hX of Stroke : stable.   DVT prophylaxis:scd Code Status: DNR Family Communication: daughter Son in law at bedside Disposition Plan: return to ALF/memory care. PT pending. Facility not ready for today  Consultants:  Palliative care  Procedures: None  Antimicrobials: None  Subjective: Resting comfortably.  Very hard of hearing.  Able to move her extremities.  Family is at the bedside.  Objective: Vitals:   12/25/17 0705 12/26/17 0606 12/26/17 1311 12/26/17 1316  BP: (!) 178/88 (!) 199/78 (!) 202/94   Pulse: 77 77 75   Resp: 18 14 12    Temp: 98 F (36.7 C) 98.2 F  (36.8 C)  97.9 F (36.6 C)  TempSrc: Oral Oral  Axillary  SpO2: 94% 92% 95%     Intake/Output Summary (Last 24 hours) at 12/26/2017 1536 Last data filed at 12/26/2017 1330 Gross per 24 hour  Intake 236 ml  Output -  Net 236 ml   There were no vitals filed for this visit. Weight change:   There is no height or weight on file to calculate BMI.  Examination:  General exam: Calm, heard of hearing, NAD, elderly HEENT:Oral mucosa moist, Ear/Nose WNL grossly Respiratory system: Bilateral equal air entry, no crackles and wheezing Cardiovascular system: S1 & S2 heard, No JVD/murmurs.No pedal edema. Gastrointestinal system: Abdomen soft, nontender non-distended, BS +. Nervous System:Alert/awake/oriented x1-2.  Able to move her upper and lower bilateral extremities Extremities: No edema,distal peripheral pulses palpable. Skin: No rashes,no icterus. MSK: Normal muscle bulk,tone ,power  Data Reviewed: I have personally reviewed following labs and imaging studies  CBC: Recent Labs  Lab 12/24/17 2303  WBC 8.4  HGB 11.1*  HCT 34.8*  MCV 88.8  PLT 956   Basic Metabolic Panel: Recent Labs  Lab 12/24/17 2303  NA 140  K 3.4*  CL 109  CO2 25  GLUCOSE 113*  BUN 20  CREATININE 1.00  CALCIUM 8.7*   GFR: CrCl cannot be calculated (Unknown ideal weight.). Liver Function Tests: Recent Labs  Lab 12/24/17 2303  AST 19  ALT 10  ALKPHOS 61  BILITOT 0.5  PROT 5.4*  ALBUMIN 2.9*   No results for input(s): LIPASE, AMYLASE in the last 168 hours. No results  for input(s): AMMONIA in the last 168 hours. Coagulation Profile: No results for input(s): INR, PROTIME in the last 168 hours. Cardiac Enzymes: No results for input(s): CKTOTAL, CKMB, CKMBINDEX, TROPONINI in the last 168 hours. BNP (last 3 results) No results for input(s): PROBNP in the last 8760 hours. HbA1C: No results for input(s): HGBA1C in the last 72 hours. CBG: No results for input(s): GLUCAP in the last 168  hours. Lipid Profile: No results for input(s): CHOL, HDL, LDLCALC, TRIG, CHOLHDL, LDLDIRECT in the last 72 hours. Thyroid Function Tests: No results for input(s): TSH, T4TOTAL, FREET4, T3FREE, THYROIDAB in the last 72 hours. Anemia Panel: No results for input(s): VITAMINB12, FOLATE, FERRITIN, TIBC, IRON, RETICCTPCT in the last 72 hours. Sepsis Labs: No results for input(s): PROCALCITON, LATICACIDVEN in the last 168 hours.  No results found for this or any previous visit (from the past 240 hour(s)).    Radiology Studies: Dg Hips Bilat With Pelvis 3-4 Views  Result Date: 12/26/2017 CLINICAL DATA:  Status post fall. Unable to provided clinical history. EXAM: DG HIP (WITH OR WITHOUT PELVIS) 3-4V BILAT COMPARISON:  Abdominal and pelvic radiograph dated Jun 29, 2017 FINDINGS: The bones are subjectively mildly osteopenic. The pelvis appears intact. AP and lateral views of both hips reveal mild asymmetric narrowing of the joint spaces. The articular surfaces of the femoral heads and acetabulii remain smoothly rounded. The femoral necks, intertrochanteric, and immediate subtrochanteric regions are normal. IMPRESSION: There is no acute or significant chronic bony abnormality of the pelvis or hips. There is mild to moderate osteoarthritic change of both hips. Electronically Signed   By: David  Martinique M.D.   On: 12/26/2017 12:51     Scheduled Meds: . ALPRAZolam  0.5 mg Oral QHS  . escitalopram  20 mg Oral QHS   Continuous Infusions:   LOS: 1 day   Time spent: More than 50% of that time was spent in counseling and/or coordination of care.  Antonieta Pert, MD [facility] Pager [phone]  If 7PM-7AM, please contact night-coverage [email or web address] Password [facility] 12/26/2017, 3:36 PM

## 2017-12-26 NOTE — Progress Notes (Signed)
Palliative Medicine RN Note: Patient is under [facility] care as GIP. At this time, goals are clear, and there is no further role for PMT.  If new needs arise, please call our office at [phone] from [place].  Marjie Skiff Kaytee Taliercio, RN, BSN, [facility] Palliative Medicine Team 12/26/2017 2:06 PM Office [phone]

## 2017-12-27 LAB — MRSA PCR SCREENING: MRSA by PCR: NEGATIVE

## 2017-12-27 NOTE — Clinical Social Work Note (Signed)
Clinical Social Work Assessment  Patient Details  Name: Julie Oliver MRN: [id number] Date of Birth: 11/16/1919  Date of referral:  12/27/17               Reason for consult:  Discharge Planning                Permission sought to share information with:  Case Manager, Facility Sport and exercise psychologist, Family Supports Permission granted to share information::  Yes, Verbal Permission Granted  Name::     [name]  Agency::  ALF  Relationship::  daughter  Contact Information:  [phone]  Housing/Transportation Living arrangements for the past 2 months:  [place] of Information:  Patient Patient Interpreter Needed:  None Criminal Activity/Legal Involvement Pertinent to Current Situation/Hospitalization:  No - Comment as needed Significant Relationships:  Adult Children Lives with:  Facility Resident Do you feel safe going back to the place where you live?  Yes Need for family participation in patient care:  Yes (Comment)  Care giving concerns:  CSW received referral for placement back to ALF [facility] at time of discharge. Spoke with patient's daughter Louann Liv regarding return to [facility] and continued to be followed by hospice, family in agreement.      Social Worker assessment / plan:  Spoke with patient's daughter Louann Liv  concerning ALF return. No concern at this time.   Employment status:  Retired Forensic scientist:    PT Recommendations:  ([facility]) Information / Referral to community resources:  (ALF)  Patient/Family's Response to care:  Patient's family  recognizes need for continued hospice following and patient's return to [facility] for comfort care.   Patient/Family's Understanding of and Emotional Response to Diagnosis, Current Treatment, and Prognosis:  Patient/family is realistic regarding patient hospice and EOL needs.  Family expressed understanding of CSW role and discharge process as well as medical condition. No  questions/concerns about plan or treatment.    Emotional Assessment Appearance:  Appears stated age Attitude/Demeanor/Rapport:  Unable to Assess Affect (typically observed):  Unable to Assess Orientation:  (diroriented X4) Alcohol / Substance use:  Not Applicable Psych involvement (Current and /or in the community):  No (Comment)  Discharge Needs  Concerns to be addressed:  Discharge Planning Concerns Readmission within the last 30 days:  No Current discharge risk:  Dependent with Mobility Barriers to Discharge:  Continued Medical Work up   [employer], LCSW 12/27/2017, 11:48 AM

## 2017-12-27 NOTE — Clinical Social Work Placement (Signed)
   CLINICAL SOCIAL WORK PLACEMENT  NOTE  Date:  12/27/2017  Patient Details  Name: Julie Oliver MRN: [id number] Date of Birth: 1919/02/16  Clinical Social Work is seeking post-discharge placement for this patient at the [place] level of care (*CSW will initial, date and re-position this form in  chart as items are completed):      Patient/family provided with [facility] Work Department's list of facilities offering this level of care within the geographic area requested by the patient (or if unable, by the patient's family).      Patient/family informed of their freedom to choose among providers that offer the needed level of care, that participate in Medicare, Medicaid or managed care program needed by the patient, have an available bed and are willing to accept the patient.      Patient/family informed of [facility]'s ownership interest in [facility] and [facility], as well as of the fact that they are under no obligation to receive care at these facilities.  PASRR submitted to EDS on       PASRR number received on (ALF)     Existing PASRR number confirmed on       FL2 transmitted to all facilities in geographic area requested by pt/family on       FL2 transmitted to all facilities within larger geographic area on       Patient informed that his/her managed care company has contracts with or will negotiate with certain facilities, including the following:        Yes   Patient/family informed of bed offers received.  Patient chooses bed at [facility]     Physician recommends and patient chooses bed at      Patient to be transferred to [place] on 12/27/17.  Patient to be transferred to facility by [facility]     Patient family notified on 12/27/17 of transfer.  Name of family member notified:  [name]     PHYSICIAN       Additional Comment:     _______________________________________________ Alberteen Sam, LCSW 12/27/2017, 11:51 AM

## 2017-12-27 NOTE — Discharge Summary (Signed)
Physician Discharge Summary  Julie Oliver PYP:[id number] DOB: 24-Jan-1920 DOA: 12/24/2017  PCP: Lajean Manes, MD  Admit date: 12/24/2017 Discharge date: 12/27/2017  Recommendations for Outpatient Follow-up:  1. Continue care for dementia.  Follow-up Information    Lajean Manes, MD Follow up.   Specialty:  Internal Medicine Why:  as needed Contact information: [address] [place] [id number]            Discharge Diagnoses:  1. PRINCIPAL Unwitnessed fall at care facility found on floor by staff, reported hip pain and inability to ambulate. 2. Hospice diagnosis cerebral atherosclerosis. 3. Essential HTN 4. Dementia with behavioral disturbance, acute confusion, anxiety, depression  Discharge Condition: improved Disposition: return to ALF  Diet recommendation: as per usual at facility  History of present illness:  84 year old woman PMH dementia, stroke, presented after a fall at outside facility, unwitnessed.  Did not complain of any pain but there is concern for slight external rotation of the left leg and shortening.  No hip pain.  Noted to be more confused than usual.  Concern for hip fracture initially.  Family initially refused diagnostic images and would not consider any invasive treatment.  Plan was for comfort care on admission, pain control, consider hospice care.    Hospital Course:  Family refused IV fluids, antibiotics, blood pressure medications, lab draws.  Palliative care consulted and and in discussion with family, it was confirmed again patient full comfort measures, continue home medications, benzodiazepines, pain medication as needed.  Hospice evaluated, this is felt to be GIP.  Patient ambulated with a walker with physical therapy, discussed with hospice nurse, felt stable to return to [facility].  Discussed with POA and her husband at bedside, they also prefer to return to [facility].  Patient has required no pain medication since admission.   She will return on chronic medications with no new medications.  After further discussion, the family requested imaging which was negative for fracture.  The family has requested no further treatment.  Patient stable for discharge 11/16, discussed with hospice and social worker.  Per social worker report, [facility] refuses to take the patient until 11/18, despite the fact the patient has had no change to her medications.  I reviewed medications with the patient's daughter who reports the only 2 medications are Xanax and Lexapro.  Therefore I discontinued all other medications as they were incorrectly listed as current medications.  Unwitnessed fall at care facility found on floor by staff, reported hip pain and inability to ambulate.  Differential broad and family refused investigation.  PMP queried stroke.  Per chart the patient has fallen multiple times recently and refuses to use a cane.  On Xanax at bedtime.  --Family initially refused diagnostic imaging including CT head, neck, x-ray left hip.  Subsequent hip and pelvis x-ray negative.  I do not see any definite evidence of stroke and would rule this out at this point as the family has refused further investigation. --Walked 20 feet with rolling walker with physical therapy.  Accommodation to return to ALF if they can provide level of care. -- Has not required any narcotics or analgesics during hospitalization  Hospice diagnosis cerebral atherosclerosis.  Essential HTN --Family requested no treatment  Dementia with behavioral disturbance, acute confusion, anxiety, depression --Treated with Xanax, Lexapro, haloperidol as an outpatient.  Per chart benzodiazepine dependent.  Today's assessment: S: Feels okay.  Denies pain. O: Vitals:  Vitals:   12/26/17 2250 12/27/17 0458  BP: (!) 197/116 (!) 186/84  Pulse: (!) 127 88  Resp:  18  Temp: 99 F (37.2 C) 98.8 F (37.1 C)  SpO2: 95% 96%    Constitutional:  . Appears calm and  comfortable Respiratory:  . CTA bilaterally, no w/r/r.  . Respiratory effort normal.  Cardiovascular:  . RRR, no m/r/g . No LE extremity edema   Abdomen:  . Soft, nontender, nondistended Musculoskeletal:  . RUE, LUE, RLE, LLE   . Moves all extremities.  Lifts both legs off the bed without apparent pain. Psychiatric:  . Mental status o Difficult to assess mood, affect  Data:  Admission labs reviewed  Bilateral hip, pelvis views plain films: No acute or chronic bony abnormality.  Discharge Instructions   Allergies as of 12/27/2017   No Known Allergies     Medication List    STOP taking these medications   glycopyrrolate 1 MG tablet Commonly known as:  ROBINUL   haloperidol 0.5 MG tablet Commonly known as:  HALDOL   LORazepam 2 MG/ML concentrated solution Commonly known as:  ATIVAN   morphine CONCENTRATE 10 MG/0.5ML Soln concentrated solution     TAKE these medications   ALPRAZolam 0.5 MG tablet Commonly known as:  XANAX Take 1 tablet (0.5 mg total) by mouth at bedtime. 2200   escitalopram 20 MG tablet Commonly known as:  LEXAPRO Take 20 mg by mouth at bedtime. 2200      No Known Allergies  The results of significant diagnostics from this hospitalization (including imaging, microbiology, ancillary and laboratory) are listed below for reference.    Significant Diagnostic Studies: Dg Hips Bilat With Pelvis 3-4 Views  Result Date: 12/26/2017 CLINICAL DATA:  Status post fall. Unable to provided clinical history. EXAM: DG HIP (WITH OR WITHOUT PELVIS) 3-4V BILAT COMPARISON:  Abdominal and pelvic radiograph dated Jun 29, 2017 FINDINGS: The bones are subjectively mildly osteopenic. The pelvis appears intact. AP and lateral views of both hips reveal mild asymmetric narrowing of the joint spaces. The articular surfaces of the femoral heads and acetabulii remain smoothly rounded. The femoral necks, intertrochanteric, and immediate subtrochanteric regions are normal.  IMPRESSION: There is no acute or significant chronic bony abnormality of the pelvis or hips. There is mild to moderate osteoarthritic change of both hips. Electronically Signed   By: David  Martinique M.D.   On: 12/26/2017 12:51     Labs: Basic Metabolic Panel: Recent Labs  Lab 12/24/17 2303  NA 140  K 3.4*  CL 109  CO2 25  GLUCOSE 113*  BUN 20  CREATININE 1.00  CALCIUM 8.7*   Liver Function Tests: Recent Labs  Lab 12/24/17 2303  AST 19  ALT 10  ALKPHOS 61  BILITOT 0.5  PROT 5.4*  ALBUMIN 2.9*   CBC: Recent Labs  Lab 12/24/17 2303  WBC 8.4  HGB 11.1*  HCT 34.8*  MCV 88.8  PLT 212    Principal Problem:   Fall Active Problems:   Hypertension   Depression with anxiety   Dementia ([place])   Stroke [facility])   Time coordinating discharge: 35 minutes  Signed:  Murray Hodgkins, MD [facility] 12/27/2017, 11:11 AM

## 2017-12-27 NOTE — Progress Notes (Signed)
CSW spoke with Julie Oliver on 11/15 regarding patient returning. They report patient is able to return on Monday 12/27/17. CSW will fax fl2 and discharge summary this weekend to ensure timely discharge on Monday.   [place], Daisy

## 2017-12-27 NOTE — Progress Notes (Addendum)
Pt  was sleepy this morning. Per night RN, pt was fidgety most of the time last night and just started to sleep at 0600. 0800 Assisted pt for breakfast, ate 80%.   1200 Assisted pt to put on her clothes, more awake now. Discharge packet avail and given to Mabel. Pt's daughter present upon discharge.  I tried calling multiple times to [facility] for report but RN not answering, message and tel # left to the receptionist. 1230 Discharged pt to [facility] via [place].

## 2017-12-27 NOTE — Progress Notes (Signed)
Patient will DC KJ:[id number] Anticipated DC date:12/27/17 Family notified: [name] RJ:[id number]  Per MD patient ready for DC to [facility]. RN, patient, patient's family, and facility notified of DC. Discharge Summary sent to facility. RN given number for report [phone]. DC packet on chart. Ambulance transport requested for patient.  CSW signing off.  [facility], [place]

## 2017-12-27 NOTE — [place] FL2 (Signed)
  [place] MEDICAID FL2 LEVEL OF CARE SCREENING TOOL     IDENTIFICATION  Patient Name: Julie Oliver Birthdate: 11-Nov-1919 Sex: female Admission Date (Current Location): 12/24/2017  [facility] and [place] Number:  Herbalist and Address:  [facility] [place]. [facility], [place] [address], [place], [place] [address]      Provider Number: [id number]  Attending Physician Name and Address:  Samuella Cota, MD  Relative Name and Phone Number:  Louann Liv [phone]    Current Level of Care: Hospital Recommended Level of Care: [place] Prior Approval Number:    Date Approved/Denied:   PASRR Number:    Discharge Plan: Other (Comment)(ALF [facility])    Current Diagnoses: Patient Active Problem List   Diagnosis Date Noted  . Stroke ([place]) 12/25/2017  . Fall 12/24/2017  . Hypertension 12/24/2017  . Depression with anxiety 12/24/2017  . Dementia ([place]) 12/24/2017  . Weakness   . Ileus ([place])   . Lower urinary tract infectious disease   . Palliative care encounter   . Comfort measures only status   . AKI (acute kidney injury) ([place])   . Altered mental status 06/29/2017    Orientation RESPIRATION BLADDER Height & Weight     (Disoriented X4)  Normal External catheter, Incontinent Weight:   Height:     BEHAVIORAL SYMPTOMS/MOOD NEUROLOGICAL BOWEL NUTRITION STATUS      Continent Diet(per usual at facility)  AMBULATORY STATUS COMMUNICATION OF NEEDS Skin   Extensive Assist Verbally Other (Comment)(dry, intact)                       Personal Care Assistance Level of Assistance  Bathing, Feeding, Dressing, Total care Bathing Assistance: Maximum assistance Feeding assistance: Limited assistance   Total Care Assistance: Maximum assistance   Functional Limitations Info  Sight, Hearing, Speech Sight Info: Impaired Hearing Info: Impaired Speech Info: Adequate    SPECIAL CARE FACTORS FREQUENCY  (Hospice to continue to follow)                    Contractures Contractures Info: Not present    Additional Factors Info  Code Status, Allergies Code Status Info: DNR Allergies Info: No Known Allergies           Current Medications (12/27/2017):  This is the current hospital active medication list Medication List    STOP taking these medications   glycopyrrolate 1 MG tablet Commonly known as:  ROBINUL   haloperidol 0.5 MG tablet Commonly known as:  HALDOL   LORazepam 2 MG/ML concentrated solution Commonly known as:  ATIVAN   morphine CONCENTRATE 10 MG/0.5ML Soln concentrated solution     TAKE these medications   ALPRAZolam 0.5 MG tablet Commonly known as:  XANAX Take 1 tablet (0.5 mg total) by mouth at bedtime. 2200   escitalopram 20 MG tablet Commonly known as:  LEXAPRO Take 20 mg by mouth at bedtime. 2200     Relevant Imaging Results:  Relevant Lab Results:   Additional Information SSN: [id number]  Alberteen Sam, LCSW

## 2017-12-27 NOTE — Progress Notes (Signed)
[facility] ([facility] GIP Visit at 10:45am  This is related and covered GIP admission of 07/01/2017 with [facility] diagnosis Cerebral Atherosclerosis per Dr. Alferd Patee. Pt has an OOF DNR. Pt fell in the bathroom of LTC facility and the facility activated EMS to bring pt to the hospital for evaluation and she was admitted for suspect hip fracture. Per MD notes: She was noted to have a slightly externally rotated left lower extremity and pain upon movement of the leg. Daughters opted for comfort care and hospice approach. No further radiologic imaging at this time.  DAY 3 of [facility] GIP.  Visited pt at bedside, pt sleeping and appears comfortable. No family at bedside during today's bedside visit. Spoke with RN who indicated no visit from family today. Bedside RN also indicated pt continue to tolerate her diet and has not required any pain medications. Dr. Sarajane Jews (Hospitalist) visited pt today and inquired about pt ongoing stay in the hospital. Spoke with Caryl Pina (CSW) who indicated the ALF facility [facility] had refused yesterday and will not take admissions over the weekend.  Per PT evaluation 11/15: Presenting with weakness and unsteadiness and required min to mod A for mobility using RW. Per notes fracture, plan is to return to ALF with hospice services. If ALF unable to provide increased assist, pt will likely require SNF level care. Pt's fracture requesting PT continue to follow acuity throughout pt's stay and will continue to follow acutely to maximize functional mobility independence and safety.   Goals of care remain the same with comfort care only and the return to [facility] ALF. Communication with IDG team updated but no family at bedside at the time of this visit. Discharge plans again for comfort care only while awaiting discharge to ALF.  [facility] will continue to follow and follow up on anticipated discharge needs.  Please call with any related Hospice questions  and/or concerns.  Transfer summary and medication list placed in shadow chart.  Please use [facility] for ambulance transportation when pt is discharge.  Thank You,  Vicente Serene, [facility] Liaison  [place] Liaison are on AMION.

## 2018-01-11 DIAGNOSIS — R1310 Dysphagia, unspecified: Secondary | ICD-10-CM | POA: Diagnosis not present

## 2018-01-11 DIAGNOSIS — I10 Essential (primary) hypertension: Secondary | ICD-10-CM | POA: Diagnosis not present

## 2018-01-11 DIAGNOSIS — F339 Major depressive disorder, recurrent, unspecified: Secondary | ICD-10-CM | POA: Diagnosis not present

## 2018-01-11 DIAGNOSIS — I672 Cerebral atherosclerosis: Secondary | ICD-10-CM | POA: Diagnosis not present

## 2018-01-11 DIAGNOSIS — I679 Cerebrovascular disease, unspecified: Secondary | ICD-10-CM | POA: Diagnosis not present

## 2018-01-12 DIAGNOSIS — I679 Cerebrovascular disease, unspecified: Secondary | ICD-10-CM | POA: Diagnosis not present

## 2018-01-12 DIAGNOSIS — R1310 Dysphagia, unspecified: Secondary | ICD-10-CM | POA: Diagnosis not present

## 2018-01-12 DIAGNOSIS — F339 Major depressive disorder, recurrent, unspecified: Secondary | ICD-10-CM | POA: Diagnosis not present

## 2018-01-12 DIAGNOSIS — I10 Essential (primary) hypertension: Secondary | ICD-10-CM | POA: Diagnosis not present

## 2018-01-12 DIAGNOSIS — I672 Cerebral atherosclerosis: Secondary | ICD-10-CM | POA: Diagnosis not present

## 2018-02-11 DEATH — deceased

## 2018-10-28 IMAGING — CT CT HEAD W/O CM
4 series · 16 of 47 positions shown, 18 images · non-contrast
Comparison: CT HEAD June 29, 2017

CLINICAL DATA: Head trauma. History of dementia, tremor,
hypertension. On hospice.

EXAM:
CT HEAD WITHOUT CONTRAST
TECHNIQUE: Contiguous axial images were obtained from the base of the skull
through the vertex without intravenous contrast.

[Series 3: head without · axial · non-contrast · 0.40mm/px · z∈[-111,+9]mm · 7 of 33 slices shown, 9 images]
[im 5/33  brain]
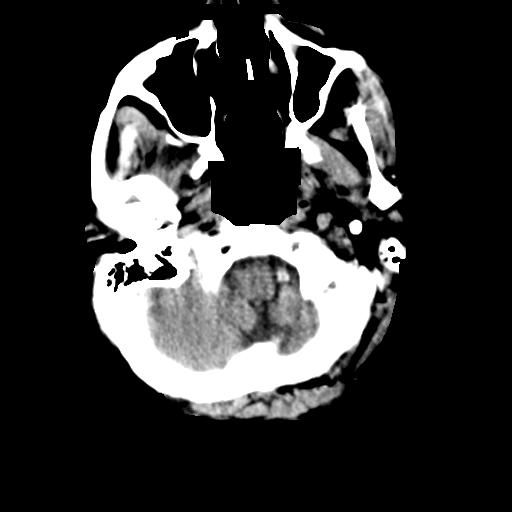
[im 5/33  bone]
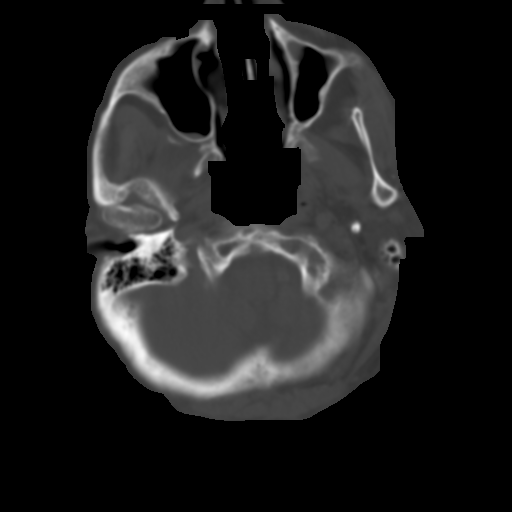
[im 9/33  brain]
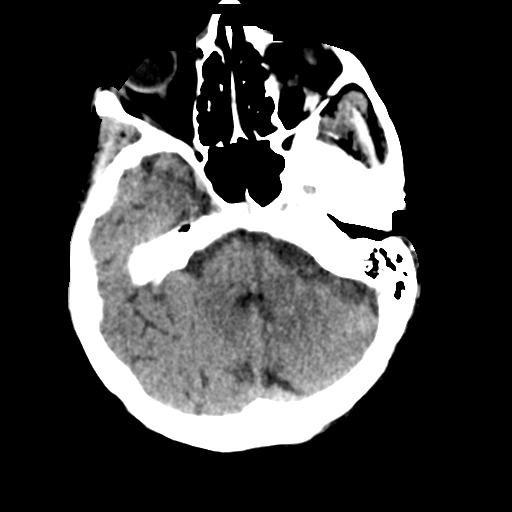
[im 13/33  brain]
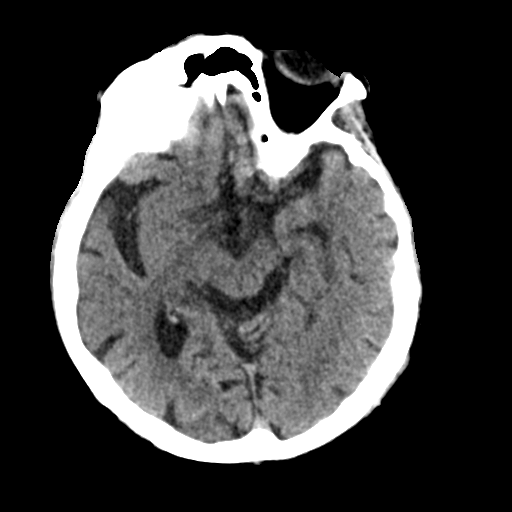
[im 17/33  brain]
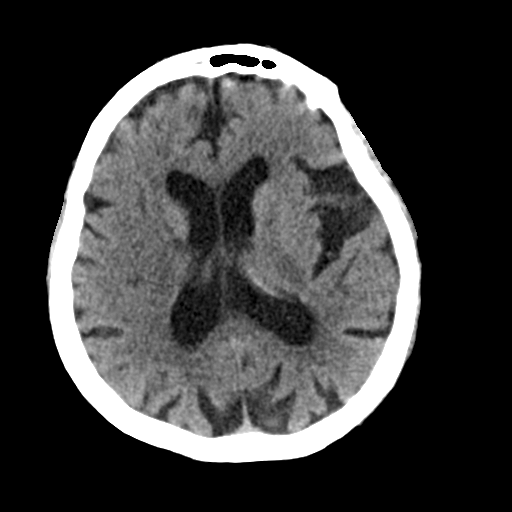
[im 21/33  brain]
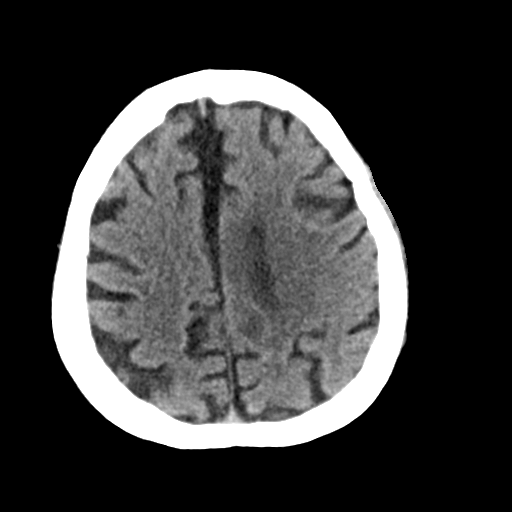
[im 21/33  bone]
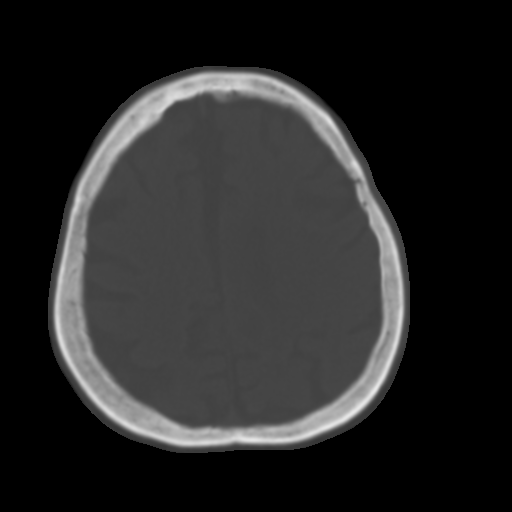
[im 25/33  brain]
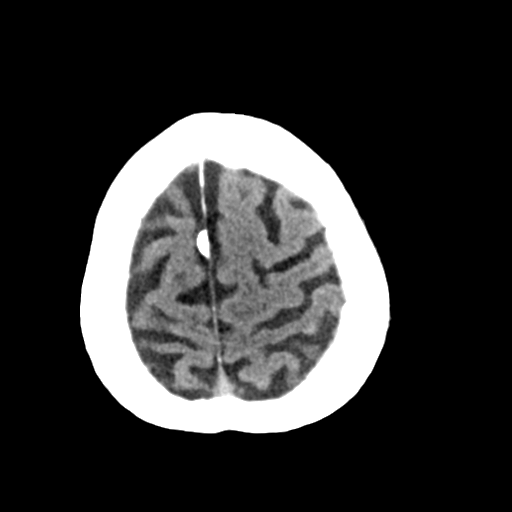
[im 29/33  brain]
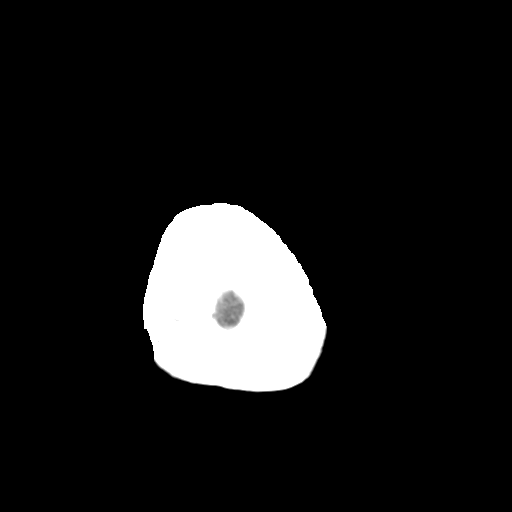

[Series 4: head bone · axial · 0.40mm/px · z∈[-115,-83]mm · 3 of 81 slices shown]
[im 9/81  bone]
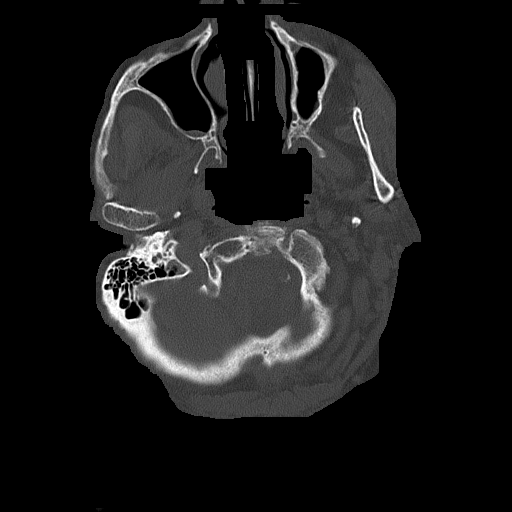
[im 17/81  bone]
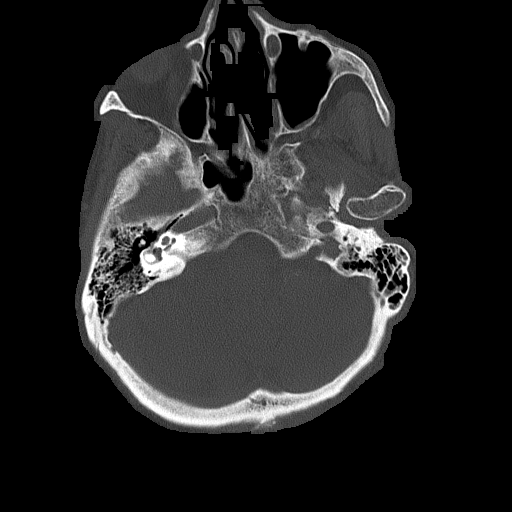
[im 25/81  bone]
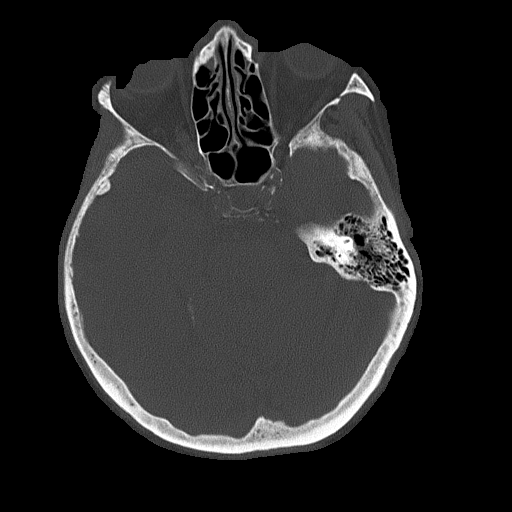

[Series 5: head without cor · coronal · non-contrast · 0.31mm/px · 3 of 67 slices shown]
[im 23/67  brain]
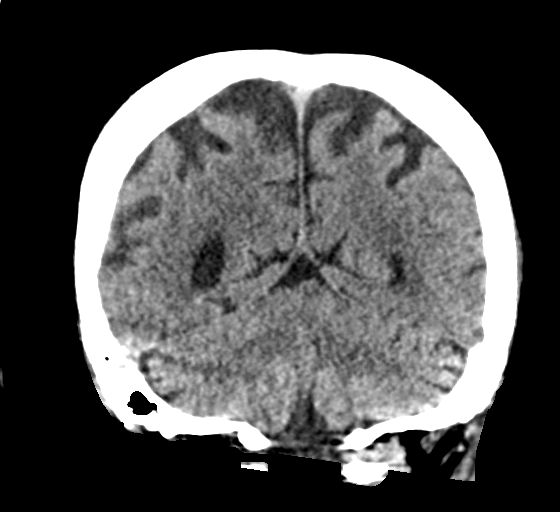
[im 30/67  brain]
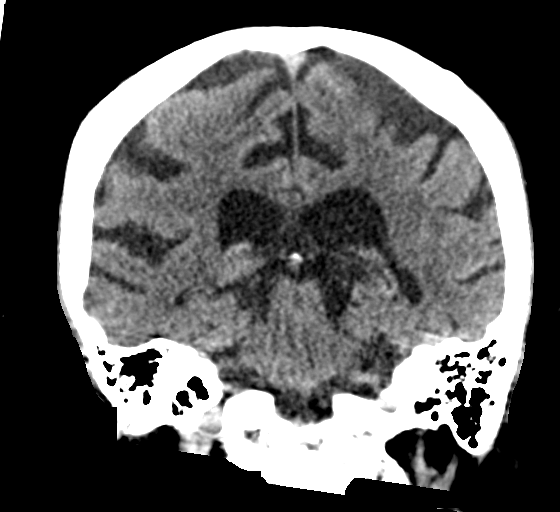
[im 37/67  brain]
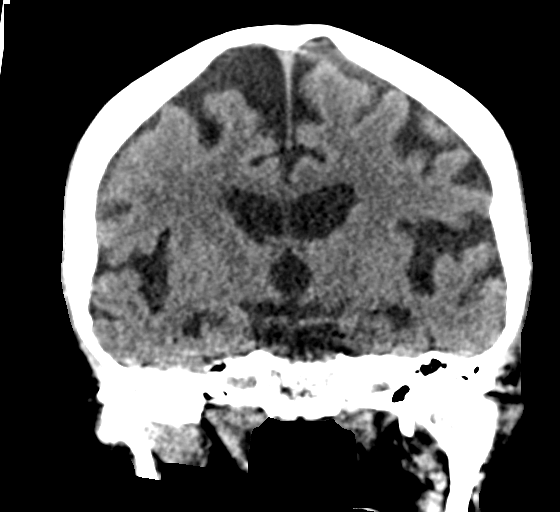

[Series 6: head without sag · sagittal · non-contrast · 0.31mm/px · 3 of 67 slices shown]
[im 28/67  brain]
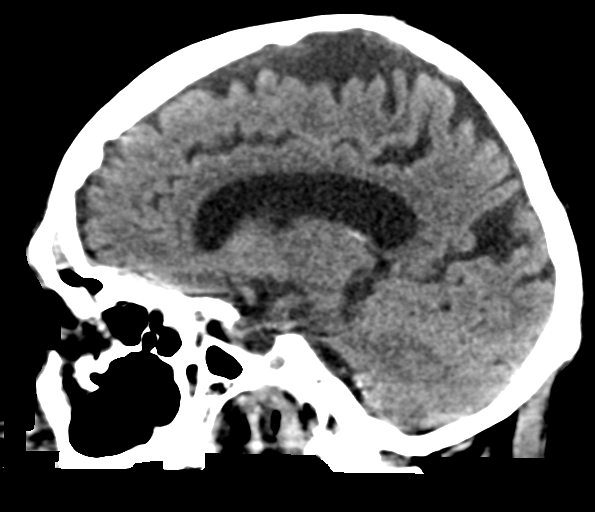
[im 34/67  brain]
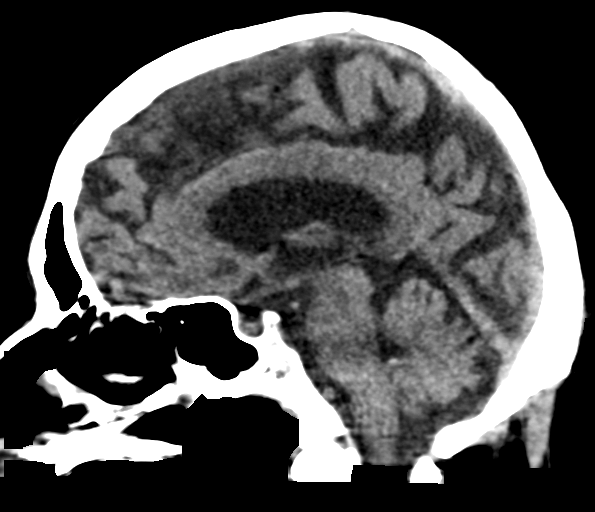
[im 40/67  brain]
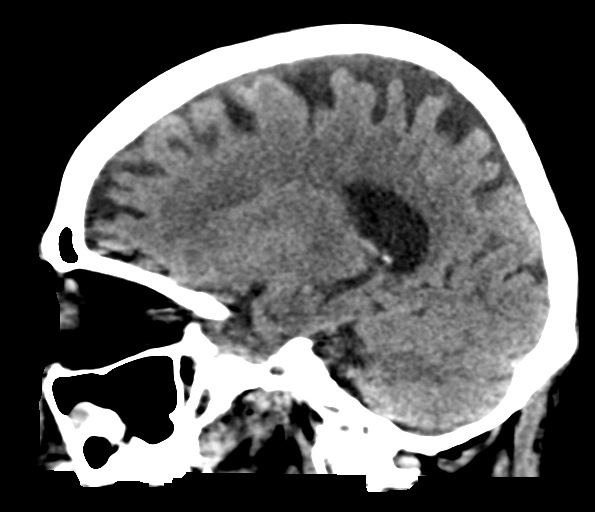

[16 of 47 positions shown; findings below may reference images not displayed]

FINDINGS: BRAIN: No intraparenchymal hemorrhage, mass effect nor midline
shift. Moderate to severe parenchymal brain volume loss. Old
bilateral basal ganglia lacunar infarcts. Patchy supratentorial
white matter hypodensities less than expected for patient's age,
though non-specific are most compatible with chronic small vessel
ischemic disease. No acute large vascular territory infarcts. No
abnormal extra-axial fluid collections. Basal cisterns are patent.

VASCULAR: Moderate calcific atherosclerosis of the carotid siphons.

SKULL: No skull fracture. Small LEFT frontoparietal scalp hematoma.
No subcutaneous gas or radiopaque foreign bodies.

SINUSES/ORBITS: Chronic RIGHT frontal sinusitis with mucoperiosteal
reaction. Mild paranasal sinus mucosal thickening with small mucosal
retention cyst. LEFT middle ear and mastoid effusion.The included
ocular globes and orbital contents are non-suspicious. Status post
bilateral ocular lens implants.

OTHER: None.
IMPRESSION: 1. No acute intracranial process. Small LEFT scalp hematoma. No
skull fracture.
2. Stable examination including moderate to severe parenchymal brain
volume loss and old basal ganglia infarcts.
